# Patient Record
Sex: Male | Born: 1985 | Race: White | Hispanic: No
Health system: Midwestern US, Community
[De-identification: ages and names within clinical notes are randomized; demographics above are authoritative.]

## PROBLEM LIST (undated history)

## (undated) HISTORY — PX: HERNIA REPAIR: SHX51

---

## 2005-05-03 ENCOUNTER — Emergency Department: Payer: Self-pay | Admitting: Emergency Medicine

## 2011-09-20 ENCOUNTER — Ambulatory Visit: Payer: Self-pay

## 2012-03-10 ENCOUNTER — Emergency Department: Payer: Self-pay | Admitting: Emergency Medicine

## 2012-03-10 LAB — COMPREHENSIVE METABOLIC PANEL
Albumin: 4.5 g/dL (ref 3.4–5.0)
Anion Gap: 3 — ABNORMAL LOW (ref 7–16)
BUN: 14 mg/dL (ref 7–18)
Bilirubin,Total: 0.5 mg/dL (ref 0.2–1.0)
Co2: 29 mmol/L (ref 21–32)
Creatinine: 1.13 mg/dL (ref 0.60–1.30)
EGFR (African American): >60
Glucose: 98 mg/dL (ref 65–99)
SGOT(AST): 18 U/L (ref 15–37)
SGPT (ALT): 21 U/L (ref 12–78)
Sodium: 137 mmol/L (ref 136–145)
Total Protein: 7.9 g/dL (ref 6.4–8.2)

## 2012-03-10 LAB — URINALYSIS, COMPLETE
Blood: NEGATIVE
Glucose,UR: NEGATIVE mg/dL (ref 0–75)
Nitrite: NEGATIVE
Ph: 8 (ref 4.5–8.0)
Squamous Epithelial: NONE SEEN
WBC UR: 1 /HPF (ref 0–5)

## 2012-03-10 LAB — CBC
HCT: 48.2 % (ref 40.0–52.0)
HGB: 16.4 g/dL (ref 13.0–18.0)
Platelet: 258 10*3/uL (ref 150–440)
RBC: 5.84 10*6/uL (ref 4.40–5.90)
WBC: 9.4 10*3/uL (ref 3.8–10.6)

## 2016-09-23 ENCOUNTER — Inpatient Hospital Stay
Admit: 2016-09-23 | Discharge: 2016-09-23 | Disposition: A | Discharge status: Home / Self Care | Payer: BLUE CROSS/BLUE SHIELD | Attending: Emergency Medicine

## 2016-09-23 DIAGNOSIS — S161XXA Strain of muscle, fascia and tendon at neck level, initial encounter: Secondary | ICD-10-CM

## 2016-09-23 MED ORDER — TRAMADOL 50 MG TAB
50 mg | ORAL_TABLET | Freq: Four times a day (QID) | ORAL | 0 refills | Status: AC | PRN
Start: 2016-09-23 — End: ?

## 2016-09-23 MED ORDER — KETOROLAC TROMETHAMINE 30 MG/ML INJECTION
30 mg/mL (1 mL) | INTRAMUSCULAR | Status: AC
Start: 2016-09-23 — End: 2016-09-23
  Administered 2016-09-23: 10:00:00 via INTRAMUSCULAR

## 2016-09-23 MED ORDER — CYCLOBENZAPRINE 10 MG TAB
10 mg | ORAL | Status: AC
Start: 2016-09-23 — End: 2016-09-23
  Administered 2016-09-23: 10:00:00 via ORAL

## 2016-09-23 MED ORDER — CYCLOBENZAPRINE 10 MG TAB
10 mg | ORAL_TABLET | Freq: Three times a day (TID) | ORAL | 0 refills | Status: AC | PRN
Start: 2016-09-23 — End: ?

## 2016-09-23 MED FILL — CYCLOBENZAPRINE 10 MG TAB: 10 mg | ORAL | Qty: 1

## 2016-09-23 MED FILL — KETOROLAC TROMETHAMINE 30 MG/ML INJECTION: 30 mg/mL (1 mL) | INTRAMUSCULAR | Qty: 2

## 2016-09-23 NOTE — ED Notes (Signed)
Patient (s) was given copy of dc instructions and two paper script(s) and no electronic scripts.  Patient (s) has verbalized understanding of instructions and script (s).  Patient was given a current medication reconciliation form and verbalized understanding of their medications.   Patient (s) has verbalized understanding of the importance of discussing medications with  his or her physician or clinic they will be following up with.  Patient alert and oriented and in no acute distress.  Patient offered wheelchair from treatment area to hospital entrance, patient declined wheelchair. Patient left ED with family.

## 2016-09-23 NOTE — ED Provider Notes (Signed)
Patient is a 31 y.o. male presenting with neck pain. The history is provided by the patient.   Neck Pain    This is a new problem. The current episode started 12 to 24 hours ago. The problem occurs constantly. The problem has not changed since onset.The pain is associated with nothing. There has been no fever. The pain is present in the left side. The quality of the pain is described as stabbing and aching. The pain is moderate. The symptoms are aggravated by bending and twisting. Stiffness is present at night. Pertinent negatives include no photophobia, no visual change, no chest pain, no syncope, no numbness, no weight loss, no headaches, no bowel incontinence, no bladder incontinence, no leg pain, no paresis, no tingling and no weakness.        History reviewed. No pertinent past medical history.    Past Surgical History:   Procedure Laterality Date   ??? HX HERNIA REPAIR           History reviewed. No pertinent family history.    Social History     Social History   ??? Marital status: DIVORCED     Spouse name: N/A   ??? Number of children: N/A   ??? Years of education: N/A     Occupational History   ??? Not on file.     Social History Main Topics   ??? Smoking status: Never Smoker   ??? Smokeless tobacco: Never Used   ??? Alcohol use Yes      Comment: Ocassionally   ??? Drug use: No   ??? Sexual activity: Not on file     Other Topics Concern   ??? Not on file     Social History Narrative   ??? No narrative on file         ALLERGIES: Review of patient's allergies indicates no known allergies.    Review of Systems   Constitutional: Negative for chills, fever and weight loss.   HENT: Negative for congestion, rhinorrhea, sneezing and sore throat.    Eyes: Negative for photophobia, redness and visual disturbance.   Respiratory: Negative for shortness of breath.    Cardiovascular: Negative for chest pain, leg swelling and syncope.   Gastrointestinal: Negative for abdominal pain, bowel incontinence, nausea and vomiting.    Genitourinary: Negative for bladder incontinence, difficulty urinating and frequency.   Musculoskeletal: Positive for neck pain. Negative for back pain, myalgias and neck stiffness.   Skin: Negative for rash.   Neurological: Negative for dizziness, tingling, syncope, weakness, numbness and headaches.   Hematological: Negative for adenopathy.   All other systems reviewed and are negative.      Vitals:    09/23/16 0526   BP: 148/86   Pulse: 80   Resp: 20   Temp: 97.9 ??F (36.6 ??C)   SpO2: 99%   Weight: 68 kg (150 lb)   Height: 5\' 8"  (1.727 m)            Physical Exam   Constitutional: He is oriented to person, place, and time. He appears well-developed and well-nourished.   HENT:   Head: Normocephalic and atraumatic.   Mouth/Throat: Oropharynx is clear and moist and mucous membranes are normal.   Eyes: EOM are normal.   Neck: Normal range of motion and full passive range of motion without pain. Neck supple.   Cardiovascular: Normal rate, regular rhythm, normal heart sounds, intact distal pulses and normal pulses.    No murmur heard.  Pulmonary/Chest: Effort normal and  breath sounds normal. No respiratory distress. He exhibits no tenderness.   Abdominal: Soft. Normal appearance and bowel sounds are normal. There is no tenderness. There is no rebound and no guarding.   Musculoskeletal:        Arms:  Neurological: He is alert and oriented to person, place, and time. He has normal strength.   Skin: Skin is warm, dry and intact. No rash noted. No erythema.   Psychiatric: He has a normal mood and affect. His speech is normal and behavior is normal. Judgment and thought content normal.   Nursing note and vitals reviewed.       MDM  Number of Diagnoses or Management Options  Strain of neck muscle, initial encounter:   Diagnosis management comments: Strain vs spasm        ED Course       Procedures    LABORATORY TESTS:  No results found for this or any previous visit (from the past 12 hour(s)).    IMAGING RESULTS:   No results found.    MEDICATIONS GIVEN:  Medications   ketorolac (TORADOL) injection 60 mg (60 mg IntraMUSCular Given 09/23/16 0620)   cyclobenzaprine (FLEXERIL) tablet 10 mg (10 mg Oral Given 09/23/16 0620)     Patient informed of results of workup and is comfortable with discharge to home to follow up with PCP.  They are instructed to return as needed for worsening condition.  IMPRESSION:  1. Strain of neck muscle, initial encounter        PLAN:  1.   Discharge Medication List as of 09/23/2016  6:34 AM      START taking these medications    Details   cyclobenzaprine (FLEXERIL) 10 mg tablet Take 1 Tab by mouth three (3) times daily as needed for Muscle Spasm(s)., Print, Disp-15 Tab, R-0      traMADol (ULTRAM) 50 mg tablet Take 1 Tab by mouth every six (6) hours as needed for Pain. Max Daily Amount: 200 mg. Indications: Pain, Print, Disp-10 Tab, R-0           2.   Follow-up Information     Follow up With Details Comments Contact Info    None Call  None [phone] Patient stated that they have no PCP      [facility] EMERGENCY DEPT  As needed, If symptoms worsen [address] [place] [address]  [phone]    [facility] - [facility]   [address] [place] [address]  [phone]        Return to ED if worse

## 2017-05-12 ENCOUNTER — Emergency Department
Admission: EM | Admit: 2017-05-12 | Discharge: 2017-05-12 | Disposition: A | Discharge status: Home / Self Care | Payer: BLUE CROSS/BLUE SHIELD | Attending: Emergency Medicine | Admitting: Emergency Medicine

## 2017-05-12 DIAGNOSIS — F17290 Nicotine dependence, other tobacco product, uncomplicated: Secondary | ICD-10-CM | POA: Diagnosis not present

## 2017-05-12 DIAGNOSIS — R0989 Other specified symptoms and signs involving the circulatory and respiratory systems: Secondary | ICD-10-CM | POA: Insufficient documentation

## 2017-05-12 MED ORDER — FAMOTIDINE 40 MG PO TABS: 40.0000 mg | ORAL_TABLET | Freq: Every evening | ORAL | 0 refills | Status: DC

## 2017-05-12 MED ORDER — OXYMETAZOLINE HCL 0.05 % NA SOLN
NASAL | Status: AC
  Filled 2017-05-12: qty 15

## 2017-05-12 MED ORDER — FAMOTIDINE 20 MG PO TABS: 20.0000 mg | ORAL_TABLET | Freq: Once | ORAL | Status: DC

## 2017-05-12 MED ORDER — FAMOTIDINE 20 MG PO TABS
40.0000 mg | ORAL_TABLET | Freq: Every day | ORAL | Status: DC
  Administered 2017-05-12: 40 mg via ORAL

## 2017-05-12 MED ORDER — FAMOTIDINE 20 MG PO TABS
ORAL_TABLET | ORAL | Status: AC
  Administered 2017-05-12: 40 mg via ORAL
  Filled 2017-05-12: qty 2

## 2017-05-12 NOTE — ED Notes (Signed)
Pt given warm coke.

## 2017-05-12 NOTE — ED Notes (Signed)
No orders at this time per MD Sharma CovertNorman

## 2017-05-12 NOTE — ED Triage Notes (Signed)
Patient c/o food stuck in throat. Patient reports he was eating a ham sandwich at approx 2000 and feels like the sandwich is stuck in upper left throat. Patient is talking in complete sentences, no distress noted, maintaining control of saliva.

## 2017-05-12 NOTE — ED Provider Notes (Signed)
[facility] Emergency Department Provider Note  ____________________________________________   First MD Initiated Contact with Patient 05/12/17 2129     (approximate)  I have reviewed the triage vital signs and the nursing notes.   HISTORY  Chief Complaint Swallowed Foreign Body   HPI Todd Fleming is a 32 y.o. male without any chronic medical conditions was presented to the emergency department with a foreign body sensation in his throat.  He says that he was eating ham sandwich around 8 PM tonight when he says that he swallowed a bite and then sniffed at the same time and says that he has had an irritation on the left side of his throat ever since.  He says that when he feels like he moves his head to the right that the foreign body moves position over the right as well.  He says that he is been without any respiratory distress and feels that he is able to breathe normally.  Says that he is also been drinking water without any issue.  History reviewed. No pertinent past medical history.  There are no active problems to display for this patient.   Past Surgical History:  Procedure Laterality Date  . HERNIA REPAIR      Prior to Admission medications   Not on File    Allergies Patient has no known allergies.  No family history on file.  Social History Social History   Tobacco Use  . Smoking status: Current Every Day Smoker    Types: E-cigarettes  . Smokeless tobacco: Current User  Substance Use Topics  . Alcohol use: Yes  . Drug use: Not on file    Review of Systems  Constitutional: No fever/chills Eyes: No visual changes. ENT: As above Cardiovascular: Denies chest pain. Respiratory: Denies shortness of breath. Gastrointestinal: No abdominal pain.  No nausea, no vomiting.  No diarrhea.  No constipation. Genitourinary: Negative for dysuria. Musculoskeletal: Negative for back pain. Skin: Negative for rash. Neurological: Negative  for headaches, focal weakness or numbness.   ____________________________________________   PHYSICAL EXAM:  VITAL SIGNS: ED Triage Vitals  Enc Vitals Group     BP 05/12/17 2038 134/68     Pulse Rate 05/12/17 2038 60     Resp 05/12/17 2038 17     Temp 05/12/17 2038 98.4 F (36.9 C)     Temp Source 05/12/17 2038 Oral     SpO2 05/12/17 2038 99 %     Weight 05/12/17 2039 155 lb (70.3 kg)     Height 05/12/17 2039 5\' 8"  (1.727 m)     Head Circumference --      Peak Flow --      Pain Score 05/12/17 2038 0     Pain Loc --      Pain Edu? --      Excl. in GC? --     Constitutional: Alert and oriented. Well appearing and in no acute distress. Eyes: Conjunctivae are normal.  Head: Atraumatic. Nose: No congestion/rhinnorhea. Mouth/Throat: Mucous membranes are moist.  On oropharyngeal exam I do not see any foreign bodies.  No swelling or erythema to the posterior pharynx. Neck: No stridor.   Cardiovascular: Normal rate, regular rhythm. Grossly normal heart sounds.   Respiratory: Normal respiratory effort.  No retractions. Lungs CTAB. Gastrointestinal: Soft and nontender. No distention.  Musculoskeletal: No lower extremity tenderness nor edema.  No joint effusions. Neurologic:  Normal speech and language. No gross focal neurologic deficits are appreciated. Skin:  Skin is  warm, dry and intact. No rash noted. Psychiatric: Mood and affect are normal. Speech and behavior are normal.  ____________________________________________   LABS (all labs ordered are listed, but only abnormal results are displayed)  Labs Reviewed - No data to display ____________________________________________  EKG   ____________________________________________  RADIOLOGY   ____________________________________________   PROCEDURES  Procedure(s) performed:    Fiberoptic laryngoscopy Date/Time: 05/12/2017 10:57 PM Performed by: Myrna BlazerSchaevitz, Rubena Roseman Matthew, MD Authorized by: Myrna BlazerSchaevitz, Makail Watling Matthew,  MD  Consent: Verbal consent obtained. Consent given by: patient Comments: Patient gave 3 sprays of aspirin to each nostril.  The patient was able to tolerate the laryngoscope to the bilateral nostrils.  The tip of the epiglottis was able to be visualized but I was unable to visualize the cords.  Patient was not tolerating the procedure well secondary to the foreign body sensation of the fiberoptic scope.  However, I was not able to visualize any foreign bodies.     Critical Care performed:   ____________________________________________   INITIAL IMPRESSION / ASSESSMENT AND PLAN / ED COURSE  Pertinent labs & imaging results that were available during my care of the patient were reviewed by me and considered in my medical decision making (see chart for details).  DDX: Esophageal foreign body, pharyngeal foreign body, laryngeal foreign body, irritation of the esophagus, irritation of the larynx As part of my medical decision making, I reviewed the following data within the electronic MEDICAL RECORD NUMBER Notes from prior ED visits  ----------------------------------------- 10:59 PM on 05/12/2017 -----------------------------------------  Discussed case with Dr. Andee PolesVaught of ear nose and throat.  He says that it is unlikely the patient has a foreign body sitting on his larynx unless into the bone.  The patient was not eating any meat containing bones.  He said that he was eating a slice of ham like a Bologna with bread.  Dr. Andee PolesVaught recommends that the patient is still having a foreign body sensation in the morning that he report to the office.  Also recommends an antacid for home use.  Patient understand the plan willing to comply.  Says that he still feels an irritation that is slight into the center of his throat at this time but does not feel a shifting sensation that he was feeling before.  Remains without any respiratory distress and able to swallow his own secretions.  Also tolerated warm cocaine  in the emergency department. ____________________________________________   FINAL CLINICAL IMPRESSION(S) / ED DIAGNOSES  Foreign body sensation.    NEW MEDICATIONS STARTED DURING THIS VISIT:  New Prescriptions   No medications on file     Note:  This document was prepared using Dragon voice recognition software and may include unintentional dictation errors.     Myrna BlazerSchaevitz, Adalynne Steffensmeier Matthew, MD 05/12/17 2300

## 2017-05-12 NOTE — ED Notes (Signed)
Pt ambulatory to toilet by self.  

## 2017-07-09 ENCOUNTER — Emergency Department: Payer: BLUE CROSS/BLUE SHIELD

## 2017-07-09 ENCOUNTER — Emergency Department
Admission: EM | Admit: 2017-07-09 | Discharge: 2017-07-09 | Disposition: A | Discharge status: Home / Self Care | Payer: BLUE CROSS/BLUE SHIELD | Attending: Emergency Medicine | Admitting: Emergency Medicine

## 2017-07-09 ENCOUNTER — Other Ambulatory Visit: Payer: Self-pay

## 2017-07-09 ENCOUNTER — Encounter: Payer: Self-pay | Admitting: Emergency Medicine

## 2017-07-09 DIAGNOSIS — F17210 Nicotine dependence, cigarettes, uncomplicated: Secondary | ICD-10-CM | POA: Diagnosis not present

## 2017-07-09 DIAGNOSIS — R0789 Other chest pain: Secondary | ICD-10-CM

## 2017-07-09 DIAGNOSIS — Z79899 Other long term (current) drug therapy: Secondary | ICD-10-CM | POA: Insufficient documentation

## 2017-07-09 DIAGNOSIS — R079 Chest pain, unspecified: Secondary | ICD-10-CM | POA: Diagnosis present

## 2017-07-09 LAB — CBC
HEMATOCRIT: 45.5 % (ref 40.0–52.0)
Hemoglobin: 15.8 g/dL (ref 13.0–18.0)
MCH: 28.3 pg (ref 26.0–34.0)
MCHC: 34.7 g/dL (ref 32.0–36.0)
MCV: 81.5 fL (ref 80.0–100.0)
Platelets: 295 10*3/uL (ref 150–440)
RBC: 5.59 MIL/uL (ref 4.40–5.90)
RDW: 13 % (ref 11.5–14.5)
WBC: 10.4 10*3/uL (ref 3.8–10.6)

## 2017-07-09 LAB — BASIC METABOLIC PANEL
Anion gap: 8 (ref 5–15)
BUN: 19 mg/dL (ref 6–20)
CO2: 28 mmol/L (ref 22–32)
Calcium: 9.3 mg/dL (ref 8.9–10.3)
Chloride: 102 mmol/L (ref 101–111)
Creatinine, Ser: 0.98 mg/dL (ref 0.61–1.24)
GFR calc Af Amer: >60 mL/min (ref >60)
GFR calc non Af Amer: >60 mL/min (ref >60)
GLUCOSE: 95 mg/dL (ref 65–99)
POTASSIUM: 3.8 mmol/L (ref 3.5–5.1)
Sodium: 138 mmol/L (ref 135–145)

## 2017-07-09 LAB — TROPONIN I
Troponin I: <0.03 ng/mL (ref <0.03)
Troponin I: <0.03 ng/mL (ref <0.03)

## 2017-07-09 MED ORDER — IOPAMIDOL (ISOVUE-370) INJECTION 76%
75.0000 mL | Freq: Once | INTRAVENOUS | Status: AC | PRN
  Administered 2017-07-09: 75 mL via INTRAVENOUS

## 2017-07-09 MED ORDER — LORAZEPAM 1 MG PO TABS: 1.0000 mg | ORAL_TABLET | Freq: Three times a day (TID) | ORAL | 0 refills | Status: AC | PRN

## 2017-07-09 NOTE — ED Notes (Signed)
Reviewed discharge instructions, follow-up care, and prescriptions with patient. Patient verbalized understanding of all information reviewed. Patient stable, with no distress noted at this time.    

## 2017-07-09 NOTE — ED Provider Notes (Signed)
[facility] Emergency Department Provider Note   ____________________________________________    I have reviewed the triage vital signs and the nursing notes.   HISTORY  Chief Complaint Chest Pain     HPI Todd Fleming is a 32 y.o. male who presents with complaints of chest pain.  Patient reports he has had discomfort behind his left nipple for approximately 1 month.  He reports while watching a movie today he developed severe pain in the left chest, he then developed shortness of breath as well and came straight to the emergency department.  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  Denies history of blood clots.  No fevers or chills.  Continues to have pain behind his left nipple currently but it has improved.  No diaphoresis or history of heart disease.  Does not smoke   History reviewed. No pertinent past medical history.  There are no active problems to display for this patient.   Past Surgical History:  Procedure Laterality Date  . HERNIA REPAIR      Prior to Admission medications   Medication Sig Start Date End Date Taking? Authorizing Provider  famotidine (PEPCID) 40 MG tablet Take 1 tablet (40 mg total) by mouth every evening. 05/12/17 05/12/18  Schaevitz, Myra Rude, MD  LORazepam (ATIVAN) 1 MG tablet Take 1 tablet (1 mg total) by mouth every 8 (eight) hours as needed for up to 10 doses for anxiety. 07/09/17 07/15/17  Jene Every, MD     Allergies Patient has no known allergies.  History reviewed. No pertinent family history.  Social History Social History   Tobacco Use  . Smoking status: Current Every Day Smoker    Types: E-cigarettes  . Smokeless tobacco: Current User  Substance Use Topics  . Alcohol use: Yes  . Drug use: Not on file    Review of Systems  Constitutional: No fever/chills Eyes: No visual changes.  ENT: No sore throat. Cardiovascular: As above Respiratory: As above Gastrointestinal: No  abdominal pain.  No nausea, no vomiting.   Genitourinary: Negative for dysuria. Musculoskeletal: Negative for back pain. Skin: Negative for rash. Neurological: Negative for headaches   ____________________________________________   PHYSICAL EXAM:  VITAL SIGNS: ED Triage Vitals [07/09/17 1817]  Enc Vitals Group     BP (!) 143/80     Pulse Rate 74     Resp 18     Temp 98.4 F (36.9 C)     Temp Source Oral     SpO2 100 %     Weight 70.3 kg (155 lb)     Height 1.753 m ( )     Head Circumference      Peak Flow      Pain Score 4     Pain Loc      Pain Edu?      Excl. in GC?     Constitutional: Alert and oriented. No acute distress. Pleasant and interactive Eyes: Conjunctivae are normal.   Nose: No congestion/rhinnorhea. Mouth/Throat: Mucous membranes are moist.    Cardiovascular: Normal rate, regular rhythm. Grossly normal heart sounds.  Good peripheral circulation. Respiratory: Normal respiratory effort.  No retractions. Lungs CTAB. Gastrointestinal: Soft and nontender. No distention.  No CVA tenderness. Genitourinary: deferred Musculoskeletal: No lower extremity tenderness nor edema.  Warm and well perfused Neurologic:  Normal speech and language. No gross focal neurologic deficits are appreciated.  Skin:  Skin is warm, dry and intact. No rash noted. Psychiatric: Mood and  affect are normal. Speech and behavior are normal.  ____________________________________________   LABS (all labs ordered are listed, but only abnormal results are displayed)  Labs Reviewed  BASIC METABOLIC PANEL  CBC  TROPONIN I  TROPONIN I   ____________________________________________  EKG  ED ECG REPORT I, Jene Every, the attending physician, personally viewed and interpreted this ECG.  Date: 07/09/2017  Rhythm: normal sinus rhythm QRS Axis: normal Intervals: Right bundle branch block ST/T Wave abnormalities: normal Narrative Interpretation: no evidence of acute  ischemia  ____________________________________________  RADIOLOGY  X-ray unremarkable CT angiography ____________________________________________   PROCEDURES  Procedure(s) performed: No  Procedures   Critical Care performed: No ____________________________________________   INITIAL IMPRESSION / ASSESSMENT AND PLAN / ED COURSE  Pertinent labs & imaging results that were available during my care of the patient were reviewed by me and considered in my medical decision making (see chart for details).  Patient presents with chest pain as described above.  No old EKG to compare to.  Given chronic pain in the left chest and history of significant amounts of travel will obtain CT angiography to rule out DVT.  Will repeat troponin as well.  ----------------------------------------- 10:10 PM on 07/09/2017 -----------------------------------------  CT scan negative for pulmonary embolism, troponin negative x2.  Patient feels well.  Will refer to cardiology for further work-up.  He is strongly suspicious that anxiety is related for his chest pain, will trial Ativan 1 mg p.o. as needed for anxiety    ____________________________________________   FINAL CLINICAL IMPRESSION(S) / ED DIAGNOSES  Final diagnoses:  Atypical chest pain        Note:  This document was prepared using Dragon voice recognition software and may include unintentional dictation errors.    Jene Every, MD 07/09/17 2211

## 2017-07-09 NOTE — ED Notes (Signed)
Patient transported to CT 

## 2017-07-09 NOTE — ED Triage Notes (Signed)
Pt arrived via POV with reports of sudden onset of chest pain while watching a movie. Pt states he has hx of anxiety and panic attacks, but states he started having some shortness of breath and chest pain on the left behind the nipple.  No prior hx of chest pain with panic attacks.

## 2017-07-09 NOTE — ED Notes (Signed)
Patient c/o intermittent left chest pain and SOB for 1 month with increasing severity today.

## 2017-07-13 ENCOUNTER — Telehealth: Payer: Self-pay

## 2017-07-13 NOTE — Telephone Encounter (Signed)
Attempted to call patient to scheduled ED fu  They were seen on 07/09/17 for CP  Will try again at a later time

## 2017-07-15 NOTE — Telephone Encounter (Signed)
Scheduled

## 2017-09-02 ENCOUNTER — Ambulatory Visit: Payer: BLUE CROSS/BLUE SHIELD | Admitting: Cardiovascular Disease

## 2017-09-02 ENCOUNTER — Encounter

## 2017-09-04 ENCOUNTER — Emergency Department
Admission: EM | Admit: 2017-09-04 | Discharge: 2017-09-04 | Disposition: A | Discharge status: Home / Self Care | Payer: BLUE CROSS/BLUE SHIELD | Attending: Emergency Medicine | Admitting: Emergency Medicine

## 2017-09-04 ENCOUNTER — Other Ambulatory Visit: Payer: Self-pay

## 2017-09-04 ENCOUNTER — Emergency Department: Payer: BLUE CROSS/BLUE SHIELD

## 2017-09-04 DIAGNOSIS — R109 Unspecified abdominal pain: Secondary | ICD-10-CM | POA: Diagnosis present

## 2017-09-04 DIAGNOSIS — K2970 Gastritis, unspecified, without bleeding: Secondary | ICD-10-CM

## 2017-09-04 DIAGNOSIS — R110 Nausea: Secondary | ICD-10-CM | POA: Insufficient documentation

## 2017-09-04 DIAGNOSIS — F17228 Nicotine dependence, chewing tobacco, with other nicotine-induced disorders: Secondary | ICD-10-CM | POA: Diagnosis not present

## 2017-09-04 DIAGNOSIS — F17290 Nicotine dependence, other tobacco product, uncomplicated: Secondary | ICD-10-CM | POA: Diagnosis not present

## 2017-09-04 LAB — COMPREHENSIVE METABOLIC PANEL
ALT: 21 U/L (ref 0–44)
ANION GAP: 10 (ref 5–15)
AST: 18 U/L (ref 15–41)
Albumin: 4.5 g/dL (ref 3.5–5.0)
Alkaline Phosphatase: 88 U/L (ref 38–126)
BUN: 14 mg/dL (ref 6–20)
CHLORIDE: 103 mmol/L (ref 98–111)
CO2: 26 mmol/L (ref 22–32)
Calcium: 9.5 mg/dL (ref 8.9–10.3)
Creatinine, Ser: 1.04 mg/dL (ref 0.61–1.24)
GFR calc non Af Amer: >60 mL/min (ref >60)
Glucose, Bld: 105 mg/dL — ABNORMAL HIGH (ref 70–99)
POTASSIUM: 4.4 mmol/L (ref 3.5–5.1)
SODIUM: 139 mmol/L (ref 135–145)
Total Bilirubin: 0.8 mg/dL (ref 0.3–1.2)
Total Protein: 7.5 g/dL (ref 6.5–8.1)

## 2017-09-04 LAB — MONONUCLEOSIS SCREEN: Mono Screen: NEGATIVE

## 2017-09-04 LAB — CBC
HEMATOCRIT: 47.6 % (ref 40.0–52.0)
Hemoglobin: 16.4 g/dL (ref 13.0–18.0)
MCH: 27.9 pg (ref 26.0–34.0)
MCHC: 34.5 g/dL (ref 32.0–36.0)
MCV: 81.1 fL (ref 80.0–100.0)
Platelets: 276 10*3/uL (ref 150–440)
RBC: 5.87 MIL/uL (ref 4.40–5.90)
RDW: 12.9 % (ref 11.5–14.5)
WBC: 14.7 10*3/uL — ABNORMAL HIGH (ref 3.8–10.6)

## 2017-09-04 LAB — URINALYSIS, COMPLETE (UACMP) WITH MICROSCOPIC
Bacteria, UA: NONE SEEN
Bilirubin Urine: NEGATIVE
GLUCOSE, UA: NEGATIVE mg/dL
Hgb urine dipstick: NEGATIVE
KETONES UR: 5 mg/dL — AB
Leukocytes, UA: NEGATIVE
Nitrite: NEGATIVE
PH: 5 (ref 5.0–8.0)
Protein, ur: NEGATIVE mg/dL
Specific Gravity, Urine: 1.02 (ref 1.005–1.030)
Squamous Epithelial / LPF: NONE SEEN (ref 0–5)

## 2017-09-04 LAB — LIPASE, BLOOD: LIPASE: 25 U/L (ref 11–51)

## 2017-09-04 MED ORDER — ONDANSETRON HCL 4 MG/2ML IJ SOLN
4.0000 mg | Freq: Once | INTRAMUSCULAR | Status: AC
  Administered 2017-09-04: 4 mg via INTRAVENOUS
  Filled 2017-09-04: qty 2

## 2017-09-04 MED ORDER — GI COCKTAIL ~~LOC~~
30.0000 mL | Freq: Once | ORAL | Status: AC
  Administered 2017-09-04: 30 mL via ORAL
  Filled 2017-09-04: qty 30

## 2017-09-04 MED ORDER — DIPHENHYDRAMINE HCL 50 MG/ML IJ SOLN
25.0000 mg | Freq: Once | INTRAMUSCULAR | Status: AC
  Administered 2017-09-04: 25 mg via INTRAVENOUS
  Filled 2017-09-04: qty 1

## 2017-09-04 MED ORDER — PANTOPRAZOLE SODIUM 40 MG PO TBEC: 40.0000 mg | DELAYED_RELEASE_TABLET | Freq: Every day | ORAL | 1 refills | Status: DC

## 2017-09-04 MED ORDER — SODIUM CHLORIDE 0.9 % IV BOLUS
1000.0000 mL | Freq: Once | INTRAVENOUS | Status: AC
  Administered 2017-09-04: 1000 mL via INTRAVENOUS

## 2017-09-04 MED ORDER — MORPHINE SULFATE (PF) 4 MG/ML IV SOLN
4.0000 mg | Freq: Once | INTRAVENOUS | Status: AC
Start: 2017-09-04 — End: 2017-09-04
  Administered 2017-09-04: 4 mg via INTRAVENOUS
  Filled 2017-09-04: qty 1

## 2017-09-04 MED ORDER — ONDANSETRON 4 MG PO TBDP: 4.0000 mg | ORAL_TABLET | Freq: Three times a day (TID) | ORAL | 0 refills | Status: DC | PRN

## 2017-09-04 MED ORDER — HYDROCODONE-ACETAMINOPHEN 5-325 MG PO TABS: 1.0000 | ORAL_TABLET | ORAL | 0 refills | Status: DC | PRN

## 2017-09-04 MED ORDER — IOPAMIDOL (ISOVUE-300) INJECTION 61%
100.0000 mL | Freq: Once | INTRAVENOUS | Status: AC | PRN
  Administered 2017-09-04: 100 mL via INTRAVENOUS

## 2017-09-04 MED ORDER — IOPAMIDOL (ISOVUE-300) INJECTION 61%
30.0000 mL | Freq: Once | INTRAVENOUS | Status: AC | PRN
  Administered 2017-09-04: 30 mL via ORAL

## 2017-09-04 NOTE — ED Notes (Signed)
Pt returned to room at this time

## 2017-09-04 NOTE — ED Notes (Signed)
Patient c/o itching and had two welts on his forearm after zofran given IV. Dr. Lenard LancePaduchowski aware.

## 2017-09-04 NOTE — ED Triage Notes (Signed)
Mid to lower sharp and stabbing pain X 2 days. Nausea. No emesis or diarrhea. Pt alert and oriented X4, active, cooperative, pt in NAD. RR even and unlabored, color WNL.  Normal BM.

## 2017-09-04 NOTE — ED Notes (Signed)
Patient transported to CT 

## 2017-09-04 NOTE — ED Notes (Signed)
Pt l  Arm redness  Subsided

## 2017-09-04 NOTE — ED Provider Notes (Addendum)
[facility] Emergency Department Provider Note  Time seen: 3:46 PM  I have reviewed the triage vital signs and the nursing notes.   HISTORY  Chief Complaint Abdominal Pain    HPI Todd Fleming is a 32 y.o. male with no past medical history who presents to the emergency department for left sided abdominal pain.  According to the patient for the past 3 days he has been experiencing intermittent significant left-sided abdominal pain along with nausea, denies any vomiting or diarrhea, fever dysuria or hematuria.  States he has had a sore throat for the past few days as well.  Went to urgent care today they tested him for mono which was negative and sent him to the emergency department for further evaluation.  Patient describes his pain is moderate and intermittent sharp pains mostly in the left upper and left mid abdomen.   History reviewed. No pertinent past medical history.  There are no active problems to display for this patient.   Past Surgical History:  Procedure Laterality Date  . HERNIA REPAIR      Prior to Admission medications   Medication Sig Start Date End Date Taking? Authorizing Provider  famotidine (PEPCID) 40 MG tablet Take 1 tablet (40 mg total) by mouth every evening. 05/12/17 05/12/18  Schaevitz, Myra Rudeavid Matthew, MD    No Known Allergies  No family history on file.  Social History Social History   Tobacco Use  . Smoking status: Current Every Day Smoker    Types: E-cigarettes  . Smokeless tobacco: Current User  Substance Use Topics  . Alcohol use: Yes  . Drug use: Not on file    Review of Systems Constitutional: Negative for fever. Eyes: Negative for visual complaints ENT: Negative for recent illness/congestion Cardiovascular: Negative for chest pain. Respiratory: Negative for shortness of breath. Gastrointestinal: Intermittent sharp left-sided abdominal pains, moderate severity.  Positive for nausea but negative for vomiting  or diarrhea Genitourinary: Negative for urinary compaints Musculoskeletal: Negative for musculoskeletal complaints Skin: Negative for skin complaints  Neurological: Negative for headache All other ROS negative  ____________________________________________   PHYSICAL EXAM:  VITAL SIGNS: ED Triage Vitals [09/04/17 1455]  Enc Vitals Group     BP 130/74     Pulse Rate 63     Resp 16     Temp 98.5 F (36.9 C)     Temp Source Oral     SpO2 100 %     Weight 155 lb (70.3 kg)     Height 5\' 9"  (1.753 m)     Head Circumference      Peak Flow      Pain Score 8     Pain Loc      Pain Edu?      Excl. in GC?    Constitutional: Alert and oriented. Well appearing and in no distress. Eyes: Normal exam ENT   Head: Normocephalic and atraumatic.   Mouth/Throat: Mucous membranes are moist. Cardiovascular: Normal rate, regular rhythm. No murmur Respiratory: Normal respiratory effort without tachypnea nor retractions. Breath sounds are clear  Gastrointestinal: Soft, moderate left upper quadrant and left mid abdominal tenderness to palpation, mild left lower quadrant tenderness, abdomen otherwise benign without rebound guarding or distention. Musculoskeletal: Nontender with normal range of motion in all extremities.  Neurologic:  Normal speech and language. No gross focal neurologic deficits  Skin:  Skin is warm, dry and intact.  Psychiatric: Mood and affect are normal.  ____________________________________________   RADIOLOGY  CT negative for  acute abnormality.  Possible gastritis.  ____________________________________________   INITIAL IMPRESSION / ASSESSMENT AND PLAN / ED COURSE  Pertinent labs & imaging results that were available during my care of the patient were reviewed by me and considered in my medical decision making (see chart for details).  Patient presents to the emergency department for left-sided abdominal pain intermittent over the past 3 days.  Differential  would include colitis, diverticulitis, urinary tract infection, pyelonephritis, mononucleosis with splenomegaly.  Patient's throat examination appears well, no obvious erythema, no exudate or tonsillar hypertrophy.  Patient does have a moderate leukocytosis of 14,000 currently, otherwise normal labs.  Afebrile.  However given the moderate tenderness to palpation will obtain a CT scan of the abdomen for further evaluation.  Patient agreeable plan of care.  CT negative for acute abnormality besides possible gastritis which would fit the clinical picture.  We will dose a GI cocktail.  Patient's mononucleosis negative labs otherwise normal, white blood cell count is slightly elevated.  We will discharge with short course of pain and nausea medication.  I discussed return precautions for any worsening pain or development of fever.  We will also discharge with Protonix.  Patient agreeable to plan of care.  ____________________________________________   FINAL CLINICAL IMPRESSION(S) / ED DIAGNOSES  Left-sided abdominal pain    Minna Antis, MD 09/04/17 [name]    Minna Antis, MD 09/04/17 [phone]

## 2017-09-09 ENCOUNTER — Other Ambulatory Visit
Admission: RE | Admit: 2017-09-09 | Discharge: 2017-09-09 | Disposition: A | Discharge status: Home / Self Care | Payer: BLUE CROSS/BLUE SHIELD | Source: Ambulatory Visit | Attending: Gastroenterology | Admitting: Gastroenterology

## 2017-09-09 ENCOUNTER — Encounter: Payer: Self-pay | Admitting: Gastroenterology

## 2017-09-09 ENCOUNTER — Ambulatory Visit: Payer: BLUE CROSS/BLUE SHIELD | Admitting: Gastroenterology

## 2017-09-09 VITALS — BP 132/82 | HR 73 | Resp 17 | Wt 157.8 lb

## 2017-09-09 DIAGNOSIS — R1013 Epigastric pain: Secondary | ICD-10-CM | POA: Diagnosis not present

## 2017-09-09 MED ORDER — OMEPRAZOLE 40 MG PO CPDR: 40.0000 mg | DELAYED_RELEASE_CAPSULE | Freq: Two times a day (BID) | ORAL | 0 refills | Status: DC

## 2017-09-09 NOTE — Progress Notes (Signed)
so  Arlyss Repress, MD [address]  [address]  [place], [place] [address]  Main: [phone]  Fax: [phone]    Gastroenterology Consultation  Referring Provider:     No ref. provider found Primary Care Physician:  Patient, No Pcp Per Primary Gastroenterologist:  Dr. Arlyss Repress Reason for Consultation:  Acute dyspepsia        HPI:   Todd Fleming is a 32 y.o. male referred from the ER  for consultation & management of 1 week history of epigastric pain, sharp in nature, worse on empty stomach associated with nausea and bloating. He went to emergency room at [facility] on 09/04/2017 with these symptoms, CBC revealed mildly elevated leukocytosis, CMP and lipase were normal, CT A/P revealed mild thickening of the gastric antrum and pylorus. He was discharged home on Zofran, Protonix 40 mg daily. Patient woke up around 4 AM today with recurrence of severe epigastric pain. He called my office to make an urgent visit. He works as a Copywriter, advertising and travels frequently, eats outside on a regular basis. On several occasions, he had raw red meat He denies weight loss. He took Pepto-Bismol about 2 days ago and noticed that his stools were black He denies diarrhea, constipation, fever, chills Unknown H. Pylori status He smokes vape cigarettes Denies taking alcohol, drug abuse  NSAIDs: none  Antiplts/Anticoagulants/Anti thrombotics: none  GI Procedures: none He had inguinal hernia repair He denies family history of GI malignancy  No past medical history on file.  Past Surgical History:  Procedure Laterality Date  . HERNIA REPAIR      Current Outpatient Medications:  .  HYDROcodone-acetaminophen (NORCO/VICODIN) 5-325 MG tablet, Take 1 tablet by mouth every 4 (four) hours as needed., Disp: 8 tablet, Rfl: 0 .  ondansetron (ZOFRAN ODT) 4 MG disintegrating tablet, Take 1 tablet (4 mg total) by mouth every 8 (eight) hours as needed for nausea or vomiting.,  Disp: 20 tablet, Rfl: 0 .  pantoprazole (PROTONIX) 40 MG tablet, Take 1 tablet (40 mg total) by mouth daily., Disp: 30 tablet, Rfl: 1 .  famotidine (PEPCID) 40 MG tablet, Take 1 tablet (40 mg total) by mouth every evening. (Patient not taking: Reported on 09/09/2017), Disp: 15 tablet, Rfl: 0 .  omeprazole (PRILOSEC) 40 MG capsule, Take 1 capsule (40 mg total) by mouth 2 (two) times daily before a meal., Disp: 60 capsule, Rfl: 0    No family history on file.   Social History   Tobacco Use  . Smoking status: Current Every Day Smoker    Types: E-cigarettes  . Smokeless tobacco: Current User  Substance Use Topics  . Alcohol use: Yes  . Drug use: Not on file    Allergies as of 09/09/2017  . (No Known Allergies)    Review of Systems:    All systems reviewed and negative except where noted in HPI.   Physical Exam:  BP 132/82 (BP Location: Left Arm, Patient Position: Sitting, Cuff Size: Normal)   Pulse 73   Resp 17   Wt 157 lb 12.8 oz (71.6 kg)   BMI 23.30 kg/m  No LMP for male patient.  General:   Alert,  Well-developed, well-nourished, pleasant and cooperative in NAD Head:  Normocephalic and atraumatic. Eyes:  Sclera clear, no icterus.   Conjunctiva pink. Ears:  Normal auditory acuity. Nose:  No deformity, discharge, or lesions. Mouth:  No deformity or lesions,oropharynx pink & moist. Neck:  Supple; no masses or thyromegaly. Lungs:  Respirations even and unlabored.  Clear throughout to auscultation.   No wheezes, crackles, or rhonchi. No acute distress. Heart:  Regular rate and rhythm; no murmurs, clicks, rubs, or gallops. Abdomen:  Normal bowel sounds. Soft, mild epigastric tenderness and non-distended without masses, hepatosplenomegaly or hernias noted.  No guarding or rebound tenderness.   Rectal: Not performed Msk:  Symmetrical without gross deformities. Good, equal movement & strength bilaterally. Pulses:  Normal pulses noted. Extremities:  No clubbing or edema.  No  cyanosis. Neurologic:  Alert and oriented x3;  grossly normal neurologically. Skin:  Intact without significant lesions or rashes. No jaundice. Lymph Nodes:  No significant cervical adenopathy. Psych:  Alert and cooperative. Normal mood and affect.  Imaging Studies: reviewed  Assessment and Plan:   Todd Fleming is a 32 y.o. Caucasian male with no sigmoid can past medical history, presents with one-week history of symptoms of dyspepsia. CT revealed mild thickening of the gastric antrum and pylorus Differentials include peptic ulcer disease or H. Pylori gastritis or less likely malignancy  - Recommend upper endoscopy for further evaluation - Patient reports that he is going out of town Sunday and returning on Thursday next week - In the interim, I'll check H. Pylori breath test and H. Pylori IgG, if positive will start treatment for H. Pylori infection - Switch from Protonix to omeprazole 40 mg twice daily - Zofran as needed - I advised him to avoid red meat, raw meat   Follow up in 4 weeks   Arlyss Repressohini R Khadim Lundberg, MD

## 2017-09-13 ENCOUNTER — Telehealth: Payer: Self-pay | Admitting: Gastroenterology

## 2017-09-13 NOTE — Telephone Encounter (Signed)
Pt mother called again for results

## 2017-09-13 NOTE — Telephone Encounter (Signed)
Pt Mother left vm to see if pt H Pal[place] test came back

## 2017-09-15 NOTE — Telephone Encounter (Signed)
Spoke with pt mom and results have been provided

## 2017-10-14 ENCOUNTER — Encounter: Payer: Self-pay | Admitting: Gastroenterology

## 2017-10-14 ENCOUNTER — Ambulatory Visit: Payer: BLUE CROSS/BLUE SHIELD | Admitting: Gastroenterology

## 2017-10-14 DIAGNOSIS — R1013 Epigastric pain: Secondary | ICD-10-CM

## 2017-10-29 ENCOUNTER — Other Ambulatory Visit: Payer: Self-pay | Admitting: Gastroenterology

## 2017-10-29 DIAGNOSIS — R1013 Epigastric pain: Secondary | ICD-10-CM

## 2017-11-13 ENCOUNTER — Other Ambulatory Visit: Payer: Self-pay | Admitting: Gastroenterology

## 2017-11-13 DIAGNOSIS — R1013 Epigastric pain: Secondary | ICD-10-CM

## 2019-12-14 IMAGING — CT CT ABD-PELV W/ CM
2 of 4 series · 16 of 46 positions shown, 18 images · IV contrast (APPLIED)
Comparison: CT 03/10/2012

CLINICAL DATA: Mid to lower sharp abdominal pain with nausea

EXAM:
CT ABDOMEN AND PELVIS WITH CONTRAST
TECHNIQUE: Multidetector CT imaging of the abdomen and pelvis was performed
using the standard protocol following bolus administration of
intravenous contrast.
CONTRAST:  100mL [id number]-ASS IOPAMIDOL (I8U9UX-ASS) INJECTION 61%

[Series 2: routine abd/pel with · axial · 0.72mm/px · z∈[-295,+105]mm · 13 of 88 slices shown, 15 images]
[im 4/88  soft-tissue]
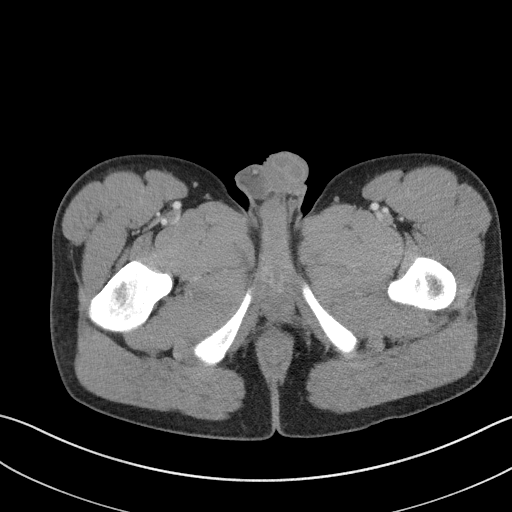
[im 4/88  bone]
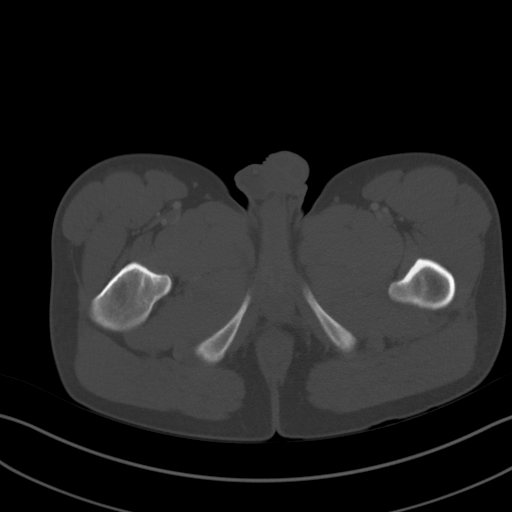
[im 11/88  soft-tissue]
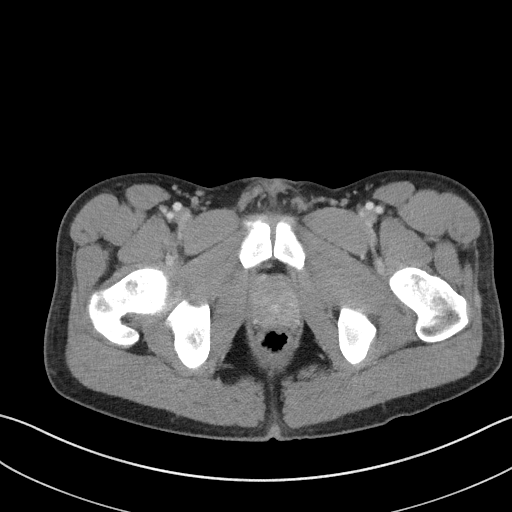
[im 18/88  soft-tissue]
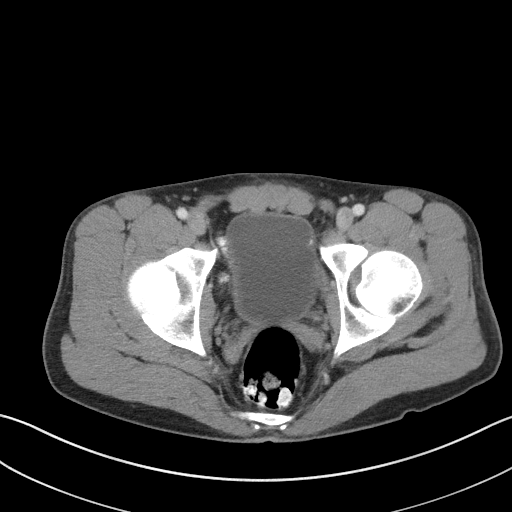
[im 25/88  soft-tissue]
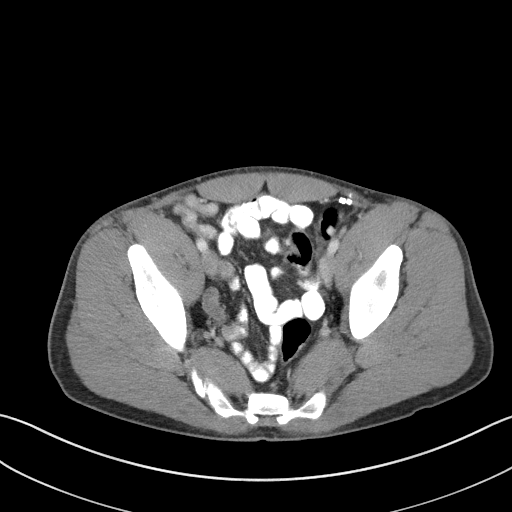
[im 32/88  soft-tissue]
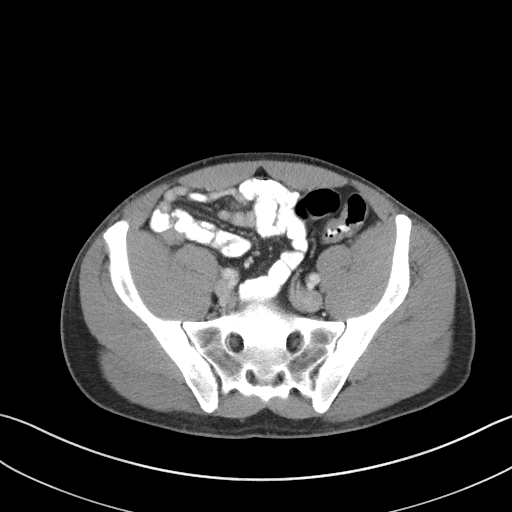
[im 39/88  soft-tissue]
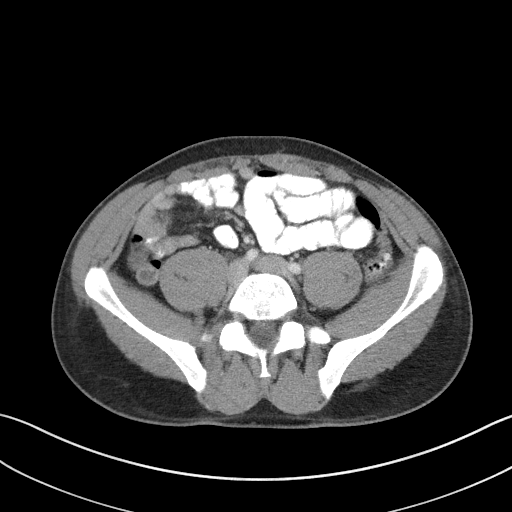
[im 46/88  soft-tissue]
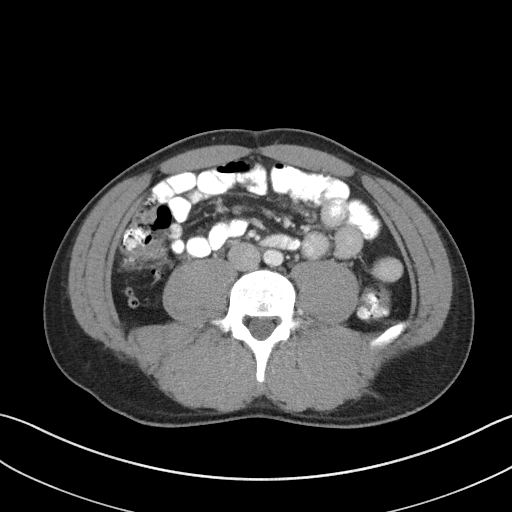
[im 49/88  soft-tissue]
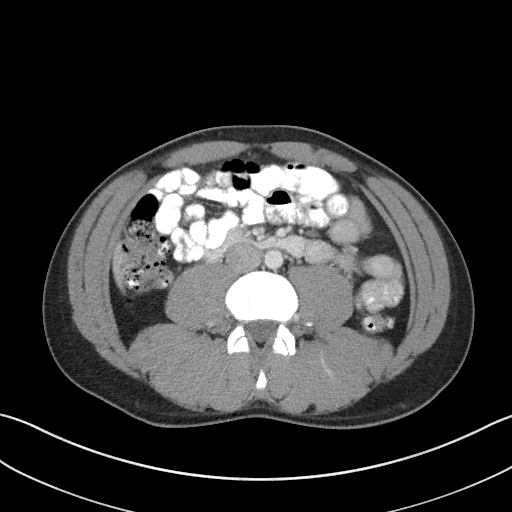
[im 56/88  soft-tissue]
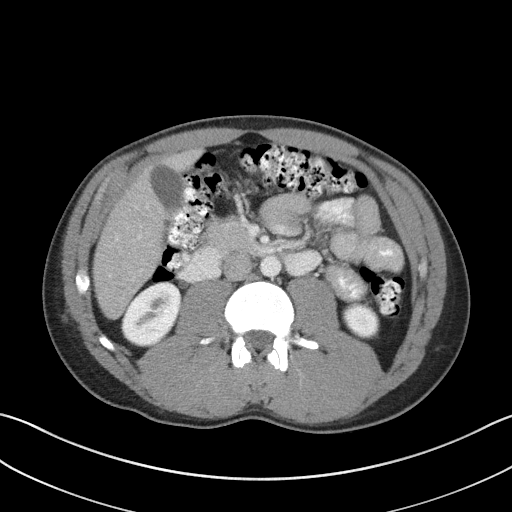
[im 56/88  bone]
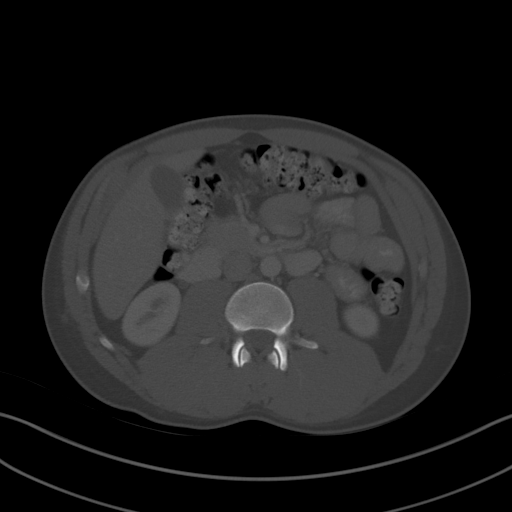
[im 63/88  soft-tissue]
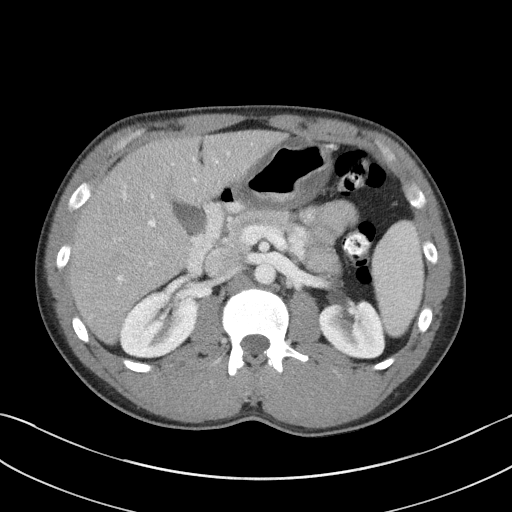
[im 70/88  soft-tissue]
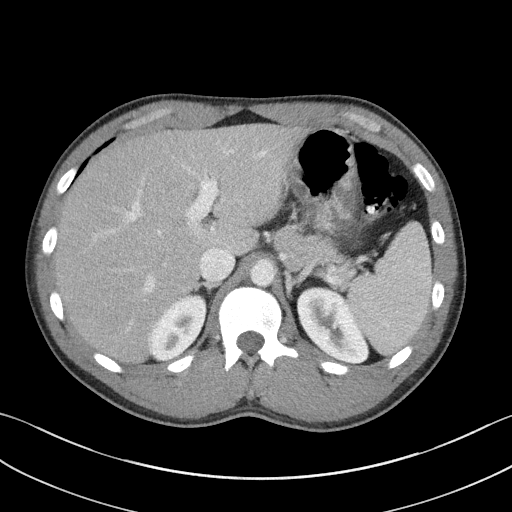
[im 77/88  soft-tissue]
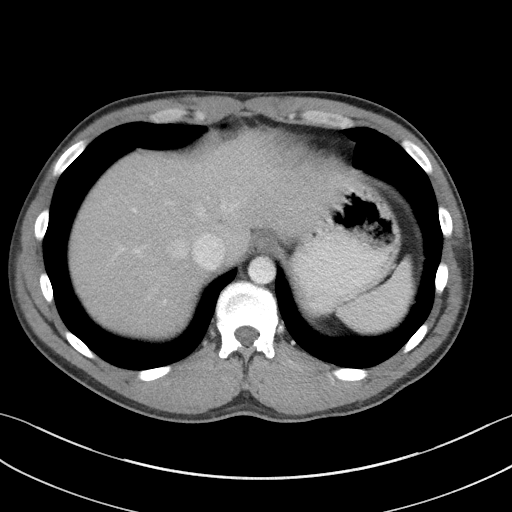
[im 84/88  soft-tissue]
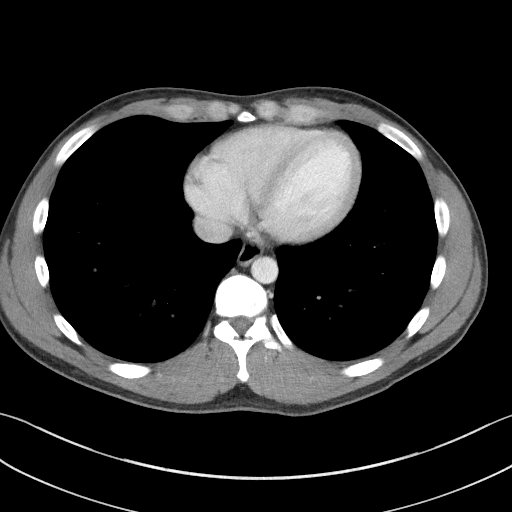

[Series 5: coronal st · coronal · 0.63mm/px · 3 of 71 slices shown]
[im 24/71  soft-tissue]
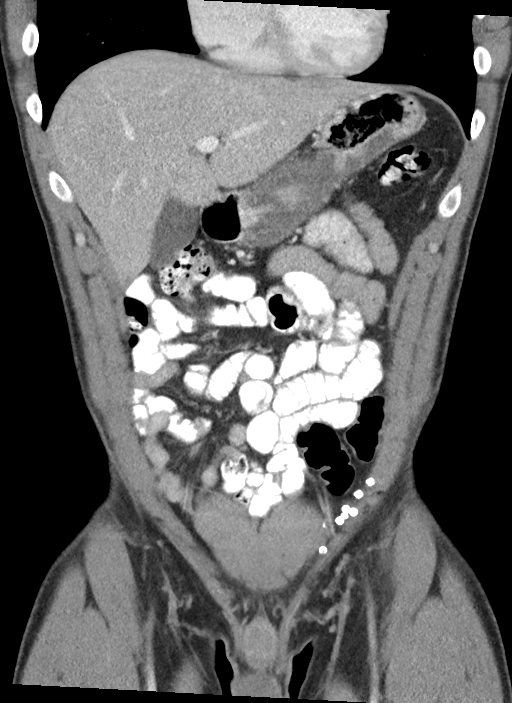
[im 32/71  soft-tissue]
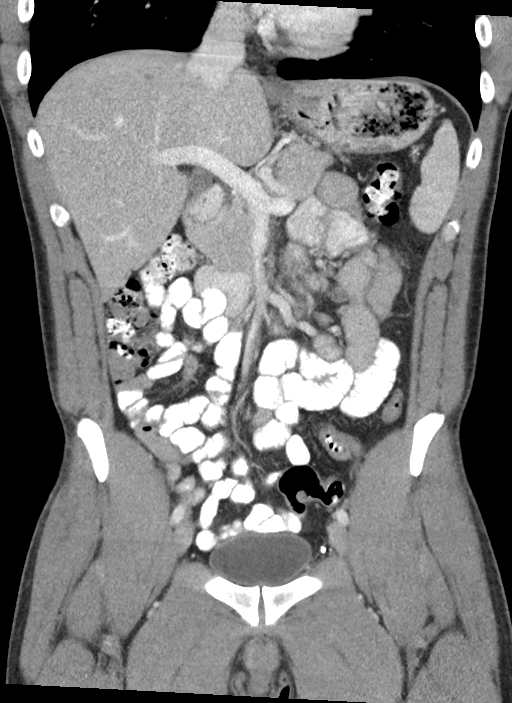
[im 39/71  soft-tissue]
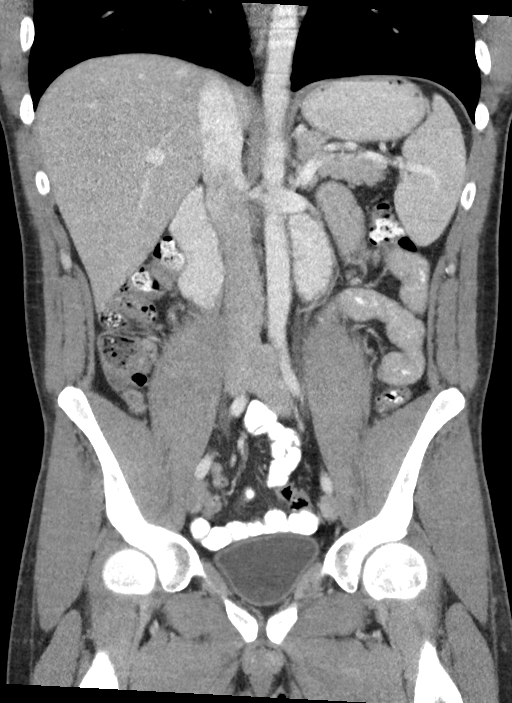

[16 of 46 positions shown; findings below may reference images not displayed]

FINDINGS: Lower chest: No acute abnormality.

Hepatobiliary: Subcentimeter hypodensity in the dome of the liver,
too small to further characterize. No calcified gallstones or
biliary dilatation

Pancreas: Unremarkable. No pancreatic ductal dilatation or
surrounding inflammatory changes.

Spleen: Normal in size without focal abnormality.

Adrenals/Urinary Tract: Adrenal glands are unremarkable. Kidneys are
normal, without renal calculi, focal lesion, or hydronephrosis.
Bladder is unremarkable.

Stomach/Bowel: Stomach is nondistended. Mild hypodense wall
thickening of the gastric antrum and pylorus. Appendix appears
normal. No evidence of bowel wall thickening, distention, or
inflammatory changes.

Vascular/Lymphatic: No significant vascular findings are present. No
enlarged abdominal or pelvic lymph nodes.

Reproductive: Prostate is unremarkable. Probable trace amount of
right hydrocele

Other: Status post left inguinal hernia repair. No evidence for
bowel containing recurrent hernia.

Musculoskeletal: No acute or significant osseous findings.
IMPRESSION: 1. No CT evidence for acute intra-abdominal or pelvic abnormality.
2. Mild hypodense wall thickening of the antral and pyloric region
of the stomach, question gastritis. Follow-up endoscopy as
indicated.
3. Possible trace right hydrocele

## 2020-02-01 DIAGNOSIS — F419 Anxiety disorder, unspecified: Secondary | ICD-10-CM | POA: Diagnosis not present

## 2020-02-07 ENCOUNTER — Encounter: Payer: Self-pay | Admitting: Emergency Medicine

## 2020-02-07 ENCOUNTER — Emergency Department
Admission: EM | Admit: 2020-02-07 | Discharge: 2020-02-07 | Disposition: A | Discharge status: Home / Self Care | Payer: BC Managed Care – PPO | Attending: Emergency Medicine | Admitting: Emergency Medicine

## 2020-02-07 ENCOUNTER — Other Ambulatory Visit: Payer: Self-pay

## 2020-02-07 DIAGNOSIS — F410 Panic disorder [episodic paroxysmal anxiety] without agoraphobia: Secondary | ICD-10-CM | POA: Diagnosis not present

## 2020-02-07 DIAGNOSIS — F419 Anxiety disorder, unspecified: Secondary | ICD-10-CM

## 2020-02-07 DIAGNOSIS — F17290 Nicotine dependence, other tobacco product, uncomplicated: Secondary | ICD-10-CM | POA: Diagnosis not present

## 2020-02-07 MED ORDER — LORAZEPAM 0.5 MG PO TABS: 0.5000 mg | ORAL_TABLET | Freq: Three times a day (TID) | ORAL | 0 refills | Status: AC | PRN

## 2020-02-07 NOTE — ED Triage Notes (Signed)
Pt reports is suffering from panic attacks and was given hydroxyzine but it has made his sx's worse. Pt also takes paroxetine as well and it helps some. Pt reports needs something that works quick. Pt also requesting to get the covid vaccine while he is here

## 2020-02-07 NOTE — ED Provider Notes (Signed)
[facility] Emergency Department Provider Note  ____________________________________________  Time seen: Approximately 7:45 PM  I have reviewed the triage vital signs and the nursing notes.   HISTORY  Chief Complaint Panic Attack    HPI Todd Fleming is a 34 y.o. male who presents the emergency department complaining of increased anxiety and panic attacks. Patient states that he has had a recent job promotion to [employer], but has noticed that his anxiety has been significantly increased. Patient has developed panic attacks as well. He saw urgent care, was started on Vistaril and paroxetine. Patient states that the paroxetine has been improving his symptoms but when he takes the Vistaril at the beginning of panic attacks and makes his panic attack much worse. He is requesting any kind of medication that can help with his panic attacks while he is awaiting his psychiatry appointment on January 6. No suicidal or homicidal ideations. Patient denies any current symptoms other than generalized anxiety. Patient states that he had a really bad anxiety attack earlier this morning prompting him to present to the emergency department for another medication.         History reviewed. No pertinent past medical history.  There are no problems to display for this patient.   Past Surgical History:  Procedure Laterality Date  . HERNIA REPAIR      Prior to Admission medications   Medication Sig Start Date End Date Taking? Authorizing Provider  famotidine (PEPCID) 40 MG tablet Take 1 tablet (40 mg total) by mouth every evening. Patient not taking: Reported on 09/09/2017 05/12/17 05/12/18  Myrna Blazer, MD  HYDROcodone-acetaminophen (NORCO/VICODIN) 5-325 MG tablet Take 1 tablet by mouth every 4 (four) hours as needed. 09/04/17   Minna Antis, MD  LORazepam (ATIVAN) 0.5 MG tablet Take 1 tablet (0.5 mg total) by mouth every 8 (eight) hours as needed for up to 20  doses (Panic Attacks). 02/07/20 02/15/20  Emileo Semel, Delorise Royals, PA-C  omeprazole (PRILOSEC) 40 MG capsule TAKE 1 CAPSULE (40 MG TOTAL) BY MOUTH 2 (TWO) TIMES DAILY BEFORE A MEAL. 11/01/17 12/01/17  Toney Reil, MD  omeprazole (PRILOSEC) 40 MG capsule TAKE 1 CAPSULE (40 MG TOTAL) BY MOUTH 2 (TWO) TIMES DAILY BEFORE A MEAL. 11/14/17 12/14/17  Toney Reil, MD  ondansetron (ZOFRAN ODT) 4 MG disintegrating tablet Take 1 tablet (4 mg total) by mouth every 8 (eight) hours as needed for nausea or vomiting. 09/04/17   Minna Antis, MD  pantoprazole (PROTONIX) 40 MG tablet Take 1 tablet (40 mg total) by mouth daily. 09/04/17 09/04/18  Minna Antis, MD    Allergies Patient has no known allergies.  No family history on file.  Social History Social History   Tobacco Use  . Smoking status: Current Every Day Smoker    Types: E-cigarettes  . Smokeless tobacco: Current User  Substance Use Topics  . Alcohol use: Yes     Review of Systems  Constitutional: No fever/chills Eyes: No visual changes. No discharge ENT: No upper respiratory complaints. Cardiovascular: no chest pain. Respiratory: no cough. No SOB. Gastrointestinal: No abdominal pain.  No nausea, no vomiting.  No diarrhea.  No constipation. Musculoskeletal: Negative for musculoskeletal pain. Skin: Negative for rash, abrasions, lacerations, ecchymosis. Neurological: Negative for headaches, focal weakness or numbness. Psychiatric: Increased anxiety and panic attacks.  No suicidal or homicidal ideation.   10 System ROS otherwise negative.  ____________________________________________   PHYSICAL EXAM:  VITAL SIGNS: ED Triage Vitals  Enc Vitals Group  BP --      Pulse Rate 02/07/20 1747 82     Resp 02/07/20 1747 20     Temp 02/07/20 1747 99 F (37.2 C)     Temp Source 02/07/20 1747 Oral     SpO2 02/07/20 1747 98 %     Weight 02/07/20 1746 150 lb (68 kg)     Height 02/07/20 1746 5\' 9"  (1.753 m)      Head Circumference --      Peak Flow --      Pain Score 02/07/20 1746 0     Pain Loc --      Pain Edu? --      Excl. in GC? --      Constitutional: Alert and oriented. Well appearing and in no acute distress. Eyes: Conjunctivae are normal. PERRL. EOMI. Head: Atraumatic. ENT:      Ears:       Nose: No congestion/rhinnorhea.      Mouth/Throat: Mucous membranes are moist.  Neck: No stridor.    Cardiovascular: Normal rate, regular rhythm. Normal S1 and S2.  Good peripheral circulation. Respiratory: Normal respiratory effort without tachypnea or retractions. Lungs CTAB. Good air entry to the bases with no decreased or absent breath sounds. Musculoskeletal: Full range of motion to all extremities. No gross deformities appreciated. Neurologic:  Normal speech and language. No gross focal neurologic deficits are appreciated.  Skin:  Skin is warm, dry and intact. No rash noted. Psychiatric: Mood and affect are normal. Speech and behavior are normal. Patient exhibits appropriate insight and judgement.  No suicidal or homicidal ideations   ____________________________________________   LABS (all labs ordered are listed, but only abnormal results are displayed)  Labs Reviewed - No data to display ____________________________________________  EKG   ____________________________________________  RADIOLOGY   No results found.  ____________________________________________    PROCEDURES  Procedure(s) performed:    Procedures    Medications - No data to display   ____________________________________________   INITIAL IMPRESSION / ASSESSMENT AND PLAN / ED COURSE  Pertinent labs & imaging results that were available during my care of the patient were reviewed by me and considered in my medical decision making (see chart for details).  Review of the [place] CSRS was performed in accordance of the [facility] prior to dispensing any controlled drugs.           Patient's diagnosis  is consistent with generalized anxiety, increased panic attacks.  Patient presented to emergency department with increased anxiety after job promotion.  Patient has been experiencing panic attacks.  He has been started on Paxil which seems to be improving his symptoms but was also placed on hydroxyzine for as needed attacks.  The hydroxyzine has increased the severity of the panic attack though the Paxil has decreased the frequency of panic attacks.  Patient will be placed on low-dose Ativan until he can see psychiatry in 2 weeks.  Discussed at length the use of benzos for anxiety control and patient understands to only use it for severe panic attack..  Follow-up with psychiatry on 6 January.  Patient is given ED precautions to return to the ED for any worsening or new symptoms.     ____________________________________________  FINAL CLINICAL IMPRESSION(S) / ED DIAGNOSES  Final diagnoses:  Anxiety  Panic attacks      NEW MEDICATIONS STARTED DURING THIS VISIT:  ED Discharge Orders         Ordered    LORazepam (ATIVAN) 0.5 MG tablet  Every 8 hours  PRN        02/07/20 2004              This chart was dictated using voice recognition software/Dragon. Despite best efforts to proofread, errors can occur which can change the meaning. Any change was purely unintentional.    Lanette Hampshire 02/07/20 2010    Phineas Semen, MD 02/07/20 2133

## 2020-02-22 ENCOUNTER — Ambulatory Visit: Payer: BC Managed Care – PPO | Admitting: Adult Health

## 2020-02-22 ENCOUNTER — Encounter: Payer: Self-pay | Admitting: Adult Health

## 2020-02-22 ENCOUNTER — Other Ambulatory Visit: Payer: Self-pay

## 2020-02-22 VITALS — BP 152/97 | HR 83 | Temp 98.0°F | Resp 16 | Ht 68.5 in | Wt 149.4 lb

## 2020-02-22 DIAGNOSIS — E559 Vitamin D deficiency, unspecified: Secondary | ICD-10-CM

## 2020-02-22 DIAGNOSIS — F411 Generalized anxiety disorder: Secondary | ICD-10-CM | POA: Insufficient documentation

## 2020-02-22 DIAGNOSIS — F410 Panic disorder [episodic paroxysmal anxiety] without agoraphobia: Secondary | ICD-10-CM | POA: Diagnosis not present

## 2020-02-22 DIAGNOSIS — Z113 Encounter for screening for infections with a predominantly sexual mode of transmission: Secondary | ICD-10-CM

## 2020-02-22 DIAGNOSIS — F419 Anxiety disorder, unspecified: Secondary | ICD-10-CM

## 2020-02-22 LAB — POCT URINALYSIS DIPSTICK
Bilirubin, UA: NEGATIVE
Blood, UA: NEGATIVE
Clarity, UA: NEGATIVE
Glucose, UA: NEGATIVE
Ketones, UA: NEGATIVE
Leukocytes, UA: NEGATIVE
Nitrite, UA: NEGATIVE
Protein, UA: NEGATIVE
Spec Grav, UA: 1.01 (ref 1.010–1.025)
Urobilinogen, UA: 0.2 E.U./dL
pH, UA: 7.5 (ref 5.0–8.0)

## 2020-02-22 MED ORDER — PAROXETINE HCL 20 MG PO TABS: 20.0000 mg | ORAL_TABLET | Freq: Every day | ORAL | 0 refills | Status: DC

## 2020-02-22 MED ORDER — LORAZEPAM 0.5 MG PO TABS: 0.5000 mg | ORAL_TABLET | Freq: Three times a day (TID) | ORAL | 0 refills | Status: DC

## 2020-02-22 NOTE — Patient Instructions (Signed)
Heat Rash, Adult  Heat rash is an itchy rash of little red bumps that often occurs during hot, humid weather. Heat rash is also called prickly heat or miliaria. Heat rash usually affects:  Armpits.  Elbows.  Groin.  Neck.  The area underneath the breasts.  Shoulders.  Chest. What are the causes? This condition is caused by blocked sweat ducts. When sweat is trapped under the skin, it spreads into surrounding tissues and causes a rash of red bumps. What increases the risk? This condition is more likely to develop in people who:  Are overdressed in hot, humid weather.  Wear clothing that rubs against the skin.  Are active in hot, humid weather.  Sweat a lot.  Are not used to hot, humid weather. What are the signs or symptoms? Symptoms of this condition include:  Small red bumps that are itchy or prickly.  Very little sweating or no sweating in the affected area. How is this diagnosed? This condition is diagnosed based on your symptoms and medical history, as well as a physical exam. How is this treated? Moving to a cool, dry place is the best treatment for heat rash. Treatment may also include medicines, such as:  Corticosteroid creams for skin irritation.  Antibiotic medicines, if the rash becomes infected. Follow these instructions at home: Skin care  Keep the affected area dry.  Do not apply ointments or creams that contain mineral oil or petroleum ingredients to your skin. These can make the condition worse.  Apply cool compresses to the affected areas.  Do not scratch your skin.  Do not take hot showers or baths. General instructions  Take over-the-counter and prescription medicines only as told by your health care provider.  If you were prescribed an antibiotic, take it as told by your health care provider. Do not stop taking it even if your condition improves.  Stay in a cool room as much as possible. Use an air conditioner or fan, if  possible.  Do not wear tight clothes. Wear comfortable, loose-fitting clothing.  Keep all follow-up visits as told by your health care provider. This is important. Contact a health care provider if:  You have a fever.  Your rash does not go away after 3-4 days.  Your rash gets worse or it is very itchy.  Your rash has pus or fluid coming from it. Get help right away if:  You are dizzy or nauseated.  You feel confused.  You have trouble breathing.  You have chest pain.  You have muscle cramps or contractions.  You faint. Summary  Heat rash is an itchy rash of little red bumps that often occurs during hot, humid weather.  Symptoms of heat rash include small red bumps that are itchy or prickly and very little or no sweating in the affected area.  This condition is diagnosed based on your symptoms and medical history, as well as a physical exam.  Moving to a cool, dry place is the best treatment for heat rash.  Do not wear tight clothes. Wear comfortable, loose-fitting clothing. This information is not intended to replace advice given to you by your health care provider. Make sure you discuss any questions you have with your health care provider. Document Revised: 01/14/2017 Document Reviewed: 04/14/2016 Elsevier Patient Education  2020 [employer] Health Maintenance, Male Adopting a healthy lifestyle and getting preventive care are important in promoting health and wellness. Ask your health care provider about:  The right schedule for you to have  regular tests and exams.  Things you can do on your own to prevent diseases and keep yourself healthy. What should I know about diet, weight, and exercise? Eat a healthy diet   Eat a diet that includes plenty of vegetables, fruits, low-fat dairy products, and lean protein.  Do not eat a lot of foods that are high in solid fats, added sugars, or sodium. Maintain a healthy weight Body mass index (BMI) is a measurement  that can be used to identify possible weight problems. It estimates body fat based on height and weight. Your health care provider can help determine your BMI and help you achieve or maintain a healthy weight. Get regular exercise Get regular exercise. This is one of the most important things you can do for your health. Most adults should:  Exercise for at least 150 minutes each week. The exercise should increase your heart rate and make you sweat (moderate-intensity exercise).  Do strengthening exercises at least twice a week. This is in addition to the moderate-intensity exercise.  Spend less time sitting. Even light physical activity can be beneficial. Watch cholesterol and blood lipids Have your blood tested for lipids and cholesterol at 36 years of age, then have this test every 5 years. You may need to have your cholesterol levels checked more often if:  Your lipid or cholesterol levels are high.  You are older than 36 years of age.  You are at high risk for heart disease. What should I know about cancer screening? Many types of cancers can be detected early and may often be prevented. Depending on your health history and family history, you may need to have cancer screening at various ages. This may include screening for:  Colorectal cancer.  Prostate cancer.  Skin cancer.  Lung cancer. What should I know about heart disease, diabetes, and high blood pressure? Blood pressure and heart disease  High blood pressure causes heart disease and increases the risk of stroke. This is more likely to develop in people who have high blood pressure readings, are of African descent, or are overweight.  Talk with your health care provider about your target blood pressure readings.  Have your blood pressure checked: ? Every 3-5 years if you are 38-97 years of age. ? Every year if you are 32 years old or older.  If you are between the ages of 78 and 67 and are a current or former smoker,  ask your health care provider if you should have a one-time screening for abdominal aortic aneurysm (AAA). Diabetes Have regular diabetes screenings. This checks your fasting blood sugar level. Have the screening done:  Once every three years after age 30 if you are at a normal weight and have a low risk for diabetes.  More often and at a younger age if you are overweight or have a high risk for diabetes. What should I know about preventing infection? Hepatitis B If you have a higher risk for hepatitis B, you should be screened for this virus. Talk with your health care provider to find out if you are at risk for hepatitis B infection. Hepatitis C Blood testing is recommended for:  Everyone born from [birth year] through 1966.  Anyone with known risk factors for hepatitis C. Sexually transmitted infections (STIs)  You should be screened each year for STIs, including gonorrhea and chlamydia, if: ? You are sexually active and are younger than 36 years of age. ? You are older than 36 years of age and your  health care provider tells you that you are at risk for this type of infection. ? Your sexual activity has changed since you were last screened, and you are at increased risk for chlamydia or gonorrhea. Ask your health care provider if you are at risk.  Ask your health care provider about whether you are at high risk for HIV. Your health care provider may recommend a prescription medicine to help prevent HIV infection. If you choose to take medicine to prevent HIV, you should first get tested for HIV. You should then be tested every 3 months for as long as you are taking the medicine. Follow these instructions at home: Lifestyle  Do not use any products that contain nicotine or tobacco, such as cigarettes, e-cigarettes, and chewing tobacco. If you need help quitting, ask your health care provider.  Do not use street drugs.  Do not share needles.  Ask your health care provider for help if you  need support or information about quitting drugs. Alcohol use  Do not drink alcohol if your health care provider tells you not to drink.  If you drink alcohol: ? Limit how much you have to 0-2 drinks a day. ? Be aware of how much alcohol is in your drink. In the [place] one drink equals one 12 oz bottle of beer (355 mL), one 5 oz glass of wine (148 mL), or one 1 oz glass of hard liquor (44 mL). General instructions  Schedule regular health, dental, and eye exams.  Stay current with your vaccines.  Tell your health care provider if: ? You often feel depressed. ? You have ever been abused or do not feel safe at home. Summary  Adopting a healthy lifestyle and getting preventive care are important in promoting health and wellness.  Follow your health care provider's instructions about healthy diet, exercising, and getting tested or screened for diseases.  Follow your health care provider's instructions on monitoring your cholesterol and blood pressure. This information is not intended to replace advice given to you by your health care provider. Make sure you discuss any questions you have with your health care provider. Document Revised: 01/25/2018 Document Reviewed: 01/25/2018 Elsevier Patient Education  2020 [employer] Inc. Lorazepam tablets What is this medicine? LORAZEPAM (lor A ze pam) is a benzodiazepine. It is used to treat anxiety. This medicine may be used for other purposes; ask your health care provider or pharmacist if you have questions. COMMON BRAND NAME(S): Ativan What should I tell my health care provider before I take this medicine? They need to know if you have any of these conditions:  glaucoma  history of drug or alcohol abuse problem  kidney disease  liver disease  lung or breathing disease, like asthma  mental illness  myasthenia gravis  Parkinson's disease  suicidal thoughts, plans, or attempt; a previous suicide attempt by you or a family  member  an unusual or allergic reaction to lorazepam, other medicines, foods, dyes, or preservatives  pregnant or trying to get pregnant  breast-feeding How should I use this medicine? Take this medicine by mouth with a glass of water. Follow the directions on the prescription label. Take your medicine at regular intervals. Do not take it more often than directed. Do not stop taking except on your doctor's advice. A special MedGuide will be given to you by the pharmacist with each prescription and refill. Be sure to read this information carefully each time. Talk to your pediatrician regarding the use of this medicine in  children. While this drug may be used in children as young as 12 years for selected conditions, precautions do apply. Overdosage: If you think you have taken too much of this medicine contact a poison control center or emergency room at once. NOTE: This medicine is only for you. Do not share this medicine with others. What if I miss a dose? If you miss a dose, take it as soon as you can. If it is almost time for your next dose, take only that dose. Do not take double or extra doses. What may interact with this medicine? Do not take this medicine with any of the following medications:  narcotic medicines for cough  sodium oxybate This medicine may also interact with the following medications:  alcohol  antihistamines for allergy, cough and cold  certain medicines for anxiety or sleep  certain medicines for depression, like amitriptyline, fluoxetine, sertraline  certain medicines for seizures like carbamazepine, phenobarbital, phenytoin, primidone  general anesthetics like lidocaine, pramoxine, tetracaine  MAOIs like Carbex, Eldepryl, Marplan, Nardil, and Parnate  medicines that relax muscles for surgery  narcotic medicines for pain  phenothiazines like chlorpromazine, mesoridazine, prochlorperazine, thioridazine This list may not describe all possible  interactions. Give your health care provider a list of all the medicines, herbs, non-prescription drugs, or dietary supplements you use. Also tell them if you smoke, drink alcohol, or use illegal drugs. Some items may interact with your medicine. What should I watch for while using this medicine? Tell your doctor or health care professional if your symptoms do not start to get better or if they get worse. Do not stop taking except on your doctor's advice. You may develop a severe reaction. Your doctor will tell you how much medicine to take. You may get drowsy or dizzy. Do not drive, use machinery, or do anything that needs mental alertness until you know how this medicine affects you. To reduce the risk of dizzy and fainting spells, do not stand or sit up quickly, especially if you are an older patient. Alcohol may increase dizziness and drowsiness. Avoid alcoholic drinks. If you are taking another medicine that also causes drowsiness, you may have more side effects. Give your health care provider a list of all medicines you use. Your doctor will tell you how much medicine to take. Do not take more medicine than directed. Call emergency for help if you have problems breathing or unusual sleepiness. What side effects may I notice from receiving this medicine? Side effects that you should report to your doctor or health care professional as soon as possible:  allergic reactions like skin rash, itching or hives, swelling of the face, lips, or tongue  breathing problems  confusion  loss of balance or coordination  signs and symptoms of low blood pressure like dizziness; feeling faint or lightheaded, falls; unusually weak or tired  suicidal thoughts or other mood changes Side effects that usually do not require medical attention (report to your doctor or health care professional if they continue or are bothersome):  dizziness  headache  nausea, vomiting  tiredness This list may not describe  all possible side effects. Call your doctor for medical advice about side effects. You may report side effects to FDA at 1-800-FDA-1088. Where should I keep my medicine? Keep out of the reach of children. This medicine can be abused. Keep your medicine in a safe place to protect it from theft. Do not share this medicine with anyone. Selling or giving away this medicine is dangerous  and against the law. This medicine may cause accidental overdose and death if taken by other adults, children, or pets. Mix any unused medicine with a substance like cat litter or coffee grounds. Then throw the medicine away in a sealed container like a sealed bag or a coffee can with a lid. Do not use the medicine after the expiration date. Store at room temperature between 20 and 25 degrees C (68 and 77 degrees F). Protect from light. Keep container tightly closed. NOTE: This sheet is a summary. It may not cover all possible information. If you have questions about this medicine, talk to your doctor, pharmacist, or health care provider.  2020 Elsevier/Gold Standard (2014-10-31 15:54:27) Paroxetine tablets What is this medicine? PAROXETINE (pa ROX e teen) is used to treat depression. It may also be used to treat anxiety disorders, obsessive compulsive disorder, panic attacks, post traumatic stress, and premenstrual dysphoric disorder (PMDD). This medicine may be used for other purposes; ask your health care provider or pharmacist if you have questions. COMMON BRAND NAME(S): Paxil, Pexeva What should I tell my health care provider before I take this medicine? They need to know if you have any of these conditions:  bipolar disorder or a family history of bipolar disorder  bleeding disorders  glaucoma  heart disease  kidney disease  liver disease  low levels of sodium in the blood  seizures  suicidal thoughts, plans, or attempt; a previous suicide attempt by you or a family member  take MAOIs like Carbex,  Eldepryl, Marplan, Nardil, and Parnate  take medicines that treat or prevent blood clots  thyroid disease  an unusual or allergic reaction to paroxetine, other medicines, foods, dyes, or preservatives  pregnant or trying to get pregnant  breast-feeding How should I use this medicine? Take this medicine by mouth with a glass of water. Follow the directions on the prescription label. You can take it with or without food. Take your medicine at regular intervals. Do not take your medicine more often than directed. Do not stop taking this medicine suddenly except upon the advice of your doctor. Stopping this medicine too quickly may cause serious side effects or your condition may worsen. A special MedGuide will be given to you by the pharmacist with each prescription and refill. Be sure to read this information carefully each time. Talk to your pediatrician regarding the use of this medicine in children. Special care may be needed. Overdosage: If you think you have taken too much of this medicine contact a poison control center or emergency room at once. NOTE: This medicine is only for you. Do not share this medicine with others. What if I miss a dose? If you miss a dose, take it as soon as you can. If it is almost time for your next dose, take only that dose. Do not take double or extra doses. What may interact with this medicine? Do not take this medicine with any of the following medications:  linezolid  MAOIs like Carbex, Eldepryl, Marplan, Nardil, and Parnate  methylene blue (injected into a vein)  pimozide  thioridazine This medicine may also interact with the following medications:  alcohol  amphetamines  aspirin and aspirin-like medicines  atomoxetine  certain medicines for depression, anxiety, or psychotic disturbances  certain medicines for irregular heart beat like propafenone, flecainide, encainide, and quinidine  certain medicines for migraine headache like  almotriptan, eletriptan, frovatriptan, naratriptan, rizatriptan, sumatriptan, zolmitriptan  cimetidine  digoxin  diuretics  fentanyl  fosamprenavir  furazolidone  isoniazid  lithium  medicines that treat or prevent blood clots like warfarin, enoxaparin, and dalteparin  medicines for sleep  NSAIDs, medicines for pain and inflammation, like ibuprofen or naproxen  phenobarbital  phenytoin  procarbazine  rasagiline  ritonavir  supplements like St. John's wort, kava kava, valerian  tamoxifen  tramadol  tryptophan This list may not describe all possible interactions. Give your health care provider a list of all the medicines, herbs, non-prescription drugs, or dietary supplements you use. Also tell them if you smoke, drink alcohol, or use illegal drugs. Some items may interact with your medicine. What should I watch for while using this medicine? Tell your doctor if your symptoms do not get better or if they get worse. Visit your doctor or health care professional for regular checks on your progress. Because it may take several weeks to see the full effects of this medicine, it is important to continue your treatment as prescribed by your doctor. Patients and their families should watch out for new or worsening thoughts of suicide or depression. Also watch out for sudden changes in feelings such as feeling anxious, agitated, panicky, irritable, hostile, aggressive, impulsive, severely restless, overly excited and hyperactive, or not being able to sleep. If this happens, especially at the beginning of treatment or after a change in dose, call your health care professional. Dennis Bast may get drowsy or dizzy. Do not drive, use machinery, or do anything that needs mental alertness until you know how this medicine affects you. Do not stand or sit up quickly, especially if you are an older patient. This reduces the risk of dizzy or fainting spells. Alcohol may interfere with the effect of  this medicine. Avoid alcoholic drinks. Your mouth may get dry. Chewing sugarless gum or sucking hard candy, and drinking plenty of water will help. Contact your doctor if the problem does not go away or is severe. What side effects may I notice from receiving this medicine? Side effects that you should report to your doctor or health care professional as soon as possible:  allergic reactions like skin rash, itching or hives, swelling of the face, lips, or tongue  anxious  black, tarry stools  changes in vision  confusion  elevated mood, decreased need for sleep, racing thoughts, impulsive behavior  eye pain  fast, irregular heartbeat  feeling faint or lightheaded, falls  feeling agitated, angry, or irritable  hallucination, loss of contact with reality  loss of balance or coordination  loss of memory  painful or prolonged erections  restlessness, pacing, inability to keep still  seizures  stiff muscles  suicidal thoughts or other mood changes  trouble sleeping  unusual bleeding or bruising  unusually weak or tired  vomiting Side effects that usually do not require medical attention (report to your doctor or health care professional if they continue or are bothersome):  change in appetite or weight  change in sex drive or performance  diarrhea  dizziness  dry mouth  increased sweating  indigestion, nausea  tired  tremors This list may not describe all possible side effects. Call your doctor for medical advice about side effects. You may report side effects to FDA at 1-800-FDA-1088. Where should I keep my medicine? Keep out of the reach of children. Store at room temperature between 15 and 30 degrees C (59 and 86 degrees F). Keep container tightly closed. Throw away any unused medicine after the expiration date. NOTE: This sheet is a summary. It may not cover all possible information.  If you have questions about this medicine, talk to your doctor,  pharmacist, or health care provider.  2020 Elsevier/Gold Standard (2015-07-05 15:50:32) Generalized Anxiety Disorder, Adult Generalized anxiety disorder (GAD) is a mental health disorder. People with this condition constantly worry about everyday events. Unlike normal anxiety, worry related to GAD is not triggered by a specific event. These worries also do not fade or get better with time. GAD interferes with life functions, including relationships, work, and school. GAD can vary from mild to severe. People with severe GAD can have intense waves of anxiety with physical symptoms (panic attacks). What are the causes? The exact cause of GAD is not known. What increases the risk? This condition is more likely to develop in:  Women.  People who have a family history of anxiety disorders.  People who are very shy.  People who experience very stressful life events, such as the death of a loved one.  People who have a very stressful family environment. What are the signs or symptoms? People with GAD often worry excessively about many things in their lives, such as their health and family. They may also be overly concerned about:  Doing well at work.  Being on time.  Natural disasters.  Friendships. Physical symptoms of GAD include:  Fatigue.  Muscle tension or having muscle twitches.  Trembling or feeling shaky.  Being easily startled.  Feeling like your heart is pounding or racing.  Feeling out of breath or like you cannot take a deep breath.  Having trouble falling asleep or staying asleep.  Sweating.  Nausea, diarrhea, or irritable bowel syndrome (IBS).  Headaches.  Trouble concentrating or remembering facts.  Restlessness.  Irritability. How is this diagnosed? Your health care provider can diagnose GAD based on your symptoms and medical history. You will also have a physical exam. The health care provider will ask specific questions about your symptoms,  including how severe they are, when they started, and if they come and go. Your health care provider may ask you about your use of alcohol or drugs, including prescription medicines. Your health care provider may refer you to a mental health specialist for further evaluation. Your health care provider will do a thorough examination and may perform additional tests to rule out other possible causes of your symptoms. To be diagnosed with GAD, a person must have anxiety that:  Is out of his or her control.  Affects several different aspects of his or her life, such as work and relationships.  Causes distress that makes him or her unable to take part in normal activities.  Includes at least three physical symptoms of GAD, such as restlessness, fatigue, trouble concentrating, irritability, muscle tension, or sleep problems. Before your health care provider can confirm a diagnosis of GAD, these symptoms must be present more days than they are not, and they must last for six months or longer. How is this treated? The following therapies are usually used to treat GAD:  Medicine. Antidepressant medicine is usually prescribed for long-term daily control. Antianxiety medicines may be added in severe cases, especially when panic attacks occur.  Talk therapy (psychotherapy). Certain types of talk therapy can be helpful in treating GAD by providing support, education, and guidance. Options include: ? Cognitive behavioral therapy (CBT). People learn coping skills and techniques to ease their anxiety. They learn to identify unrealistic or negative thoughts and behaviors and to replace them with positive ones. ? Acceptance and commitment therapy (ACT). This treatment teaches people how to  be mindful as a way to cope with unwanted thoughts and feelings. ? Biofeedback. This process trains you to manage your body's response (physiological response) through breathing techniques and relaxation methods. You will work  with a therapist while machines are used to monitor your physical symptoms.  Stress management techniques. These include yoga, meditation, and exercise. A mental health specialist can help determine which treatment is best for you. Some people see improvement with one type of therapy. However, other people require a combination of therapies. Follow these instructions at home:  Take over-the-counter and prescription medicines only as told by your health care provider.  Try to maintain a normal routine.  Try to anticipate stressful situations and allow extra time to manage them.  Practice any stress management or self-calming techniques as taught by your health care provider.  Do not punish yourself for setbacks or for not making progress.  Try to recognize your accomplishments, even if they are small.  Keep all follow-up visits as told by your health care provider. This is important. Contact a health care provider if:  Your symptoms do not get better.  Your symptoms get worse.  You have signs of depression, such as: ? A persistently sad, cranky, or irritable mood. ? Loss of enjoyment in activities that used to bring you joy. ? Change in weight or eating. ? Changes in sleeping habits. ? Avoiding friends or family members. ? Loss of energy for normal tasks. ? Feelings of guilt or worthlessness. Get help right away if:  You have serious thoughts about hurting yourself or others. If you ever feel like you may hurt yourself or others, or have thoughts about taking your own life, get help right away. You can go to your nearest emergency department or call:  Your local emergency services (911 in the [place]  A suicide crisis helpline, such as the National Suicide Prevention Lifeline at (431)396-9949. This is open 24 hours a day. Summary  Generalized anxiety disorder (GAD) is a mental health disorder that involves worry that is not triggered by a specific event.  People with GAD  often worry excessively about many things in their lives, such as their health and family.  GAD may cause physical symptoms such as restlessness, trouble concentrating, sleep problems, frequent sweating, nausea, diarrhea, headaches, and trembling or muscle twitching.  A mental health specialist can help determine which treatment is best for you. Some people see improvement with one type of therapy. However, other people require a combination of therapies. This information is not intended to replace advice given to you by your health care provider. Make sure you discuss any questions you have with your health care provider. Document Revised: 01/14/2017 Document Reviewed: 12/23/2015 Elsevier Patient Education  2020 [employer] Hyperventilation Hyperventilation is a condition that happens when you breathe faster and more deeply than usual. An episode of hyperventilation usually lasts 20-30 minutes. During an episode, you may feel breathless, and your hands, feet, or mouth may tingle, spasm, or feel numb. Hyperventilation is usually triggered by stress, anxiety, or emotions. However, it can also be a sign of another condition, such as:  A lung problem, including emphysema or asthma.  An infection.  Heart problems.  Pregnancy.  Bleeding. Follow these instructions at home:   Learn and use deep breathing exercises that help you breathe from your diaphragm and abdomen.  Practice relaxation techniques to reduce stress, such as visualization, meditation, or gentle yoga.  If you are hyperventilating, breathe into a paper bag. This  slows down breathing. Contact a health care provider if:  You continue to have episodes of hyperventilation.  Your hyperventilation gets worse. Get help right away if:  You pass out due to hyperventilation.  You have continued numbness after a period of hyperventilation. Summary  Hyperventilation is breathing more deeply and more rapidly than  normal.  During an episode, you may feel breathless, and your hands, feet, or mouth may tingle, spasm, or feel numb.  If you are hyperventilating, try breathing into a paper bag. This slows down breathing. This information is not intended to replace advice given to you by your health care provider. Make sure you discuss any questions you have with your health care provider. Document Revised: 07/04/2017 Document Reviewed: 07/04/2017 Elsevier Patient Education  2020 [employer].

## 2020-02-22 NOTE — Progress Notes (Signed)
New patient visit   Patient: Todd Fleming   DOB: Sep 27, 1985   35 y.o. Male  MRN: [id number] Visit Date: 02/22/2020  Today's healthcare provider: Marcille Buffy, FNP   Chief Complaint  Patient presents with  . New Patient (Initial Visit)   Subjective    Todd Fleming is a 35 y.o. male who presents today as a new patient to establish care.  HPI  Patient reports that he feels fairly well today but would like to address concerns of anxiety and frequent panic attacks. Patient reports that his diet has been poor due to decreased appetite and states that his sleep patters are fairly poor waking up throughout the night. Patient reports that he is staying active with daily work duties.   He has had anxiety attacks recently and was seeb 02/06/21 at ED was given lorazepam.  He is on Paroxetine 10mg  qd.   Not taking Hydroxzine.  makes him feel well. He does feel better on the paxil than he did.  Panic attacks have decreased he is having 1- 2 now was having 3- 4 .   He denies any suicidal or homicidal ideations.   Patient  denies any fever, body aches,chills, rash, chest pain, shortness of breath, nausea, vomiting, or diarrhea.   Denies dizziness, lightheadedness, pre syncopal or syncopal episodes.    History reviewed. No pertinent past medical history. Past Surgical History:  Procedure Laterality Date  . HERNIA REPAIR     No family status information on file.   History reviewed. No pertinent family history. Social History   Socioeconomic History  . Marital status: Married    Spouse name: Not on file  . Number of children: Not on file  . Years of education: Not on file  . Highest education level: Not on file  Occupational History  . Not on file  Tobacco Use  . Smoking status: Current Every Day Smoker    Types: E-cigarettes  . Smokeless tobacco: Current User  Substance and Sexual Activity  . Alcohol use: Yes  . Drug use: Not on file  . Sexual activity:  Not on file  Other Topics Concern  . Not on file  Social History Narrative  . Not on file   Social Determinants of Health   Financial Resource Strain: Not on file  Food Insecurity: Not on file  Transportation Needs: Not on file  Physical Activity: Not on file  Stress: Not on file  Social Connections: Not on file   Outpatient Medications Prior to Visit  Medication Sig  . [DISCONTINUED] LORazepam (ATIVAN) 0.5 MG tablet Take 0.5 mg by mouth every 8 (eight) hours.  . [DISCONTINUED] PARoxetine (PAXIL) 10 MG tablet Take 10 mg by mouth daily.  Marland Kitchen HYDROcodone-acetaminophen (NORCO/VICODIN) 5-325 MG tablet Take 1 tablet by mouth every 4 (four) hours as needed. (Patient not taking: Reported on 02/22/2020)  . [DISCONTINUED] famotidine (PEPCID) 40 MG tablet Take 1 tablet (40 mg total) by mouth every evening. (Patient not taking: Reported on 09/09/2017)  . [DISCONTINUED] omeprazole (PRILOSEC) 40 MG capsule TAKE 1 CAPSULE (40 MG TOTAL) BY MOUTH 2 (TWO) TIMES DAILY BEFORE A MEAL.  . [DISCONTINUED] omeprazole (PRILOSEC) 40 MG capsule TAKE 1 CAPSULE (40 MG TOTAL) BY MOUTH 2 (TWO) TIMES DAILY BEFORE A MEAL.  . [DISCONTINUED] ondansetron (ZOFRAN ODT) 4 MG disintegrating tablet Take 1 tablet (4 mg total) by mouth every 8 (eight) hours as needed for nausea or vomiting. (Patient not taking: Reported on 02/22/2020)  . [DISCONTINUED]  pantoprazole (PROTONIX) 40 MG tablet Take 1 tablet (40 mg total) by mouth daily.   No facility-administered medications prior to visit.   No Known Allergies   There is no immunization history on file for this patient.  Health Maintenance  Topic Date Due  . COVID-19 Vaccine (1) Never done  . TETANUS/TDAP  Never done  . INFLUENZA VACCINE  Never done  . Hepatitis C Screening  Completed  . HIV Screening  Completed    Patient Care Team: Flinchum, Eula Fried, FNP as PCP - General (Family Medicine)  Review of Systems  All other systems reviewed and are negative.   Last  CBC Lab Results  Component Value Date   WBC 7.8 02/22/2020   HGB 15.6 02/22/2020   HCT 47.8 02/22/2020   MCV 82 02/22/2020   MCH 26.7 02/22/2020   RDW 12.1 02/22/2020   PLT 360 02/22/2020   Last metabolic panel Lab Results  Component Value Date   GLUCOSE 66 02/22/2020   NA 140 02/22/2020   K 5.3 (H) 02/22/2020   CL 100 02/22/2020   CO2 25 02/22/2020   BUN 13 02/22/2020   CREATININE 1.03 02/22/2020   GFRNONAA 94 02/22/2020   GFRAA 108 02/22/2020   CALCIUM 10.0 02/22/2020   PROT 7.1 02/22/2020   ALBUMIN 4.9 02/22/2020   LABGLOB 2.2 02/22/2020   AGRATIO 2.2 02/22/2020   BILITOT 0.5 02/22/2020   ALKPHOS 130 (H) 02/22/2020   AST 17 02/22/2020   ALT 20 02/22/2020   ANIONGAP 10 09/04/2017   Last lipids Lab Results  Component Value Date   CHOL 154 02/22/2020   HDL 58 02/22/2020   LDLCALC 83 02/22/2020   TRIG 62 02/22/2020   CHOLHDL 2.7 02/22/2020   Last hemoglobin A1c No results found for: HGBA1C Last thyroid functions Lab Results  Component Value Date   TSH 0.605 02/22/2020   Last vitamin D Lab Results  Component Value Date   VD25OH 32.8 02/22/2020   Last vitamin B12 and Folate No results found for: VITAMINB12, FOLATE    Objective    BP (!) 152/97   Pulse 83   Temp 98 F (36.7 C) (Oral)   Resp 16   Ht 5' 8.5" (1.74 m)   Wt 149 lb 6.4 oz (67.8 kg)   SpO2 100%   BMI 22.39 kg/m  Physical Exam Vitals and nursing note reviewed.  Constitutional:      General: He is active. He is not in acute distress.    Appearance: Normal appearance. He is well-developed and well-nourished. He is not ill-appearing, toxic-appearing, sickly-appearing or diaphoretic.     Comments: Patient is alert and oriented and responsive to questions Engages in eye contact with provider. Speaks in full sentences without any pauses without any shortness of breath or distress.    HENT:     Head: Normocephalic and atraumatic.     Right Ear: Hearing, tympanic membrane, ear canal and  external ear normal.     Left Ear: Hearing, tympanic membrane, ear canal and external ear normal.     Nose: Nose normal.     Mouth/Throat:     Mouth: Oropharynx is clear and moist and mucous membranes are normal.     Pharynx: Uvula midline. No oropharyngeal exudate.  Eyes:     General: Lids are normal. No scleral icterus.       Right eye: No discharge.        Left eye: No discharge.     Extraocular Movements: EOM normal.  Conjunctiva/sclera: Conjunctivae normal.     Pupils: Pupils are equal, round, and reactive to light.  Neck:     Thyroid: No thyromegaly.     Vascular: Normal carotid pulses. No carotid bruit, hepatojugular reflux or JVD.     Trachea: Trachea and phonation normal. No tracheal tenderness or tracheal deviation.     Meningeal: Brudzinski's sign absent.  Cardiovascular:     Rate and Rhythm: Normal rate and regular rhythm.     Pulses: Normal pulses and intact distal pulses.     Heart sounds: Normal heart sounds, S1 normal and S2 normal. Heart sounds not distant. No murmur heard. No friction rub. No gallop.   Pulmonary:     Effort: Pulmonary effort is normal. No accessory muscle usage or respiratory distress.     Breath sounds: Normal breath sounds. No stridor. No wheezing or rales.  Chest:     Chest wall: No tenderness.  Abdominal:     General: Bowel sounds are normal. There is no distension. Aorta is normal.     Palpations: Abdomen is soft. There is no mass.     Tenderness: There is no abdominal tenderness. There is no guarding or rebound.     Hernia: No hernia is present.  Musculoskeletal:        General: No tenderness, deformity or edema. Normal range of motion.     Cervical back: Full passive range of motion without pain, normal range of motion and neck supple.     Comments: Patient moves on and off of exam table and in room without difficulty. Gait is normal in hall and in room. Patient is oriented to person place time and situation. Patient answers questions  appropriately and engages in conversation.   Lymphadenopathy:     Head:     Right side of head: No submental, submandibular, tonsillar, preauricular, posterior auricular or occipital adenopathy.     Left side of head: No submental, submandibular, tonsillar, preauricular, posterior auricular or occipital adenopathy.     Cervical: No cervical adenopathy.     Upper Body:  No axillary adenopathy present. Skin:    General: Skin is warm, dry and intact.     Capillary Refill: Capillary refill takes less than 2 seconds.     Coloration: Skin is not pale.     Findings: No erythema or rash.     Nails: There is no clubbing or cyanosis.  Neurological:     Mental Status: He is alert and oriented to person, place, and time.     GCS: GCS eye subscore is 4. GCS verbal subscore is 5. GCS motor subscore is 6.     Cranial Nerves: No cranial nerve deficit.     Sensory: No sensory deficit.     Motor: No abnormal muscle tone.     Coordination: He displays a negative Romberg sign. Coordination normal.     Gait: Gait normal.     Deep Tendon Reflexes: Strength normal and reflexes are normal and symmetric. Reflexes normal.  Psychiatric:        Mood and Affect: Mood and affect normal.        Speech: Speech normal.        Behavior: Behavior normal.        Thought Content: Thought content normal.        Cognition and Memory: Cognition and memory normal.        Judgment: Judgment normal.     Depression Screen PHQ 2/9 Scores 02/22/2020  PHQ - 2  Score 3  PHQ- 9 Score 10   Results for orders placed or performed in visit on 02/22/20  CBC with Differential/Platelet  Result Value Ref Range   WBC 7.8 3.4 - 10.8 x10E3/uL   RBC 5.84 (H) 4.14 - 5.80 x10E6/uL   Hemoglobin 15.6 13.0 - 17.7 g/dL   Hematocrit 46.6 59.9 - 51.0 %   MCV 82 79 - 97 fL   MCH 26.7 26.6 - 33.0 pg   MCHC 32.6 31.5 - 35.7 g/dL   RDW 35.7 01.7 - 79.3 %   Platelets 360 150 - 450 x10E3/uL   Neutrophils 66 Not Estab. %   Lymphs 18 Not  Estab. %   Monocytes 13 Not Estab. %   Eos 2 Not Estab. %   Basos 1 Not Estab. %   Neutrophils Absolute 5.2 1.4 - 7.0 x10E3/uL   Lymphocytes Absolute 1.4 0.7 - 3.1 x10E3/uL   Monocytes Absolute 1.0 (H) 0.1 - 0.9 x10E3/uL   EOS (ABSOLUTE) 0.1 0.0 - 0.4 x10E3/uL   Basophils Absolute 0.1 0.0 - 0.2 x10E3/uL   Immature Granulocytes 0 Not Estab. %   Immature Grans (Abs) 0.0 0.0 - 0.1 x10E3/uL  Comprehensive metabolic panel  Result Value Ref Range   Glucose 66 65 - 99 mg/dL   BUN 13 6 - 20 mg/dL   Creatinine, Ser 9.03 0.76 - 1.27 mg/dL   GFR calc non Af Amer 94 >59 mL/min/1.73   GFR calc Af Amer 108 >59 mL/min/1.73   BUN/Creatinine Ratio 13 9 - 20   Sodium 140 134 - 144 mmol/L   Potassium 5.3 (H) 3.5 - 5.2 mmol/L   Chloride 100 96 - 106 mmol/L   CO2 25 20 - 29 mmol/L   Calcium 10.0 8.7 - 10.2 mg/dL   Total Protein 7.1 6.0 - 8.5 g/dL   Albumin 4.9 4.0 - 5.0 g/dL   Globulin, Total 2.2 1.5 - 4.5 g/dL   Albumin/Globulin Ratio 2.2 1.2 - 2.2   Bilirubin Total 0.5 0.0 - 1.2 mg/dL   Alkaline Phosphatase 130 (H) 44 - 121 IU/L   AST 17 0 - 40 IU/L   ALT 20 0 - 44 IU/L  Lipid panel  Result Value Ref Range   Cholesterol, Total 154 100 - 199 mg/dL   Triglycerides 62 0 - 149 mg/dL   HDL 58 >00 mg/dL   VLDL Cholesterol Cal 13 5 - 40 mg/dL   LDL Chol Calc (NIH) 83 0 - 99 mg/dL   Chol/HDL Ratio 2.7 0.0 - 5.0 ratio  TSH  Result Value Ref Range   TSH 0.605 0.450 - 4.500 uIU/mL  VITAMIN D 25 Hydroxy (Vit-D Deficiency, Fractures)  Result Value Ref Range   Vit D, 25-Hydroxy 32.8 30.0 - 100.0 ng/mL  HSV(herpes simplex vrs) 1+2 ab-IgM  Result Value Ref Range   HSVI/II Comb IgM <0.91 0.00 - 0.90 Ratio  HSV(herpes simplex vrs) 1+2 ab-IgG  Result Value Ref Range   HSV 1 Glycoprotein G Ab, IgG <0.91 0.00 - 0.90 index   HSV 2 IgG, Type Spec <0.91 0.00 - 0.90 index  HIV antibody (with reflex)  Result Value Ref Range   HIV Screen 4th Generation wRfx Non Reactive Non Reactive  Hepatitis C Antibody   Result Value Ref Range   Hep C Virus Ab <0.1 0.0 - 0.9 s/co ratio  RPR  Result Value Ref Range   RPR Ser Ql Non Reactive Non Reactive  POCT urinalysis dipstick  Result Value Ref Range   Color, UA  yellow clear    Clarity, UA negative    Glucose, UA Negative Negative   Bilirubin, UA negative    Ketones, UA negative    Spec Grav, UA 1.010 1.010 - 1.025   Blood, UA negative    pH, UA 7.5 5.0 - 8.0   Protein, UA Negative Negative   Urobilinogen, UA 0.2 0.2 or 1.0 E.U./dL   Nitrite, UA negative    Leukocytes, UA Negative Negative   Appearance     Odor      Assessment & Plan     Panic attacks - Plan: CBC with Differential/Platelet, Comprehensive metabolic panel, Lipid panel, TSH, POCT urinalysis dipstick, PARoxetine (PAXIL) 20 MG tablet, LORazepam (ATIVAN) 0.5 MG tablet  Anxiety - Plan: CBC with Differential/Platelet, Comprehensive metabolic panel, Lipid panel, TSH, POCT urinalysis dipstick, PARoxetine (PAXIL) 20 MG tablet, LORazepam (ATIVAN) 0.5 MG tablet  Vitamin D deficiency - Plan: VITAMIN D 25 Hydroxy (Vit-D Deficiency, Fractures)  Screen for STD (sexually transmitted disease) - Plan: HSV(herpes simplex vrs) 1+2 ab-IgM, HSV(herpes simplex vrs) 1+2 ab-IgG, HIV antibody (with reflex), Hepatitis C Antibody, RPR  Orders Placed This Encounter  Procedures  . CBC with Differential/Platelet  . Comprehensive metabolic panel  . Lipid panel  . TSH  . VITAMIN D 25 Hydroxy (Vit-D Deficiency, Fractures)  . HSV(herpes simplex vrs) 1+2 ab-IgM  . HSV(herpes simplex vrs) 1+2 ab-IgG  . HIV antibody (with reflex)  . Hepatitis C Antibody  . RPR  . POCT urinalysis dipstick   Meds ordered this encounter  Medications  . PARoxetine (PAXIL) 20 MG tablet    Sig: Take 1 tablet (20 mg total) by mouth daily.    Dispense:  90 tablet    Refill:  0  . LORazepam (ATIVAN) 0.5 MG tablet    Sig: Take 1 tablet (0.5 mg total) by mouth every 8 (eight) hours.    Dispense:  30 tablet    Refill:  0    Discussed known black box warning for anti depression/ anxiety medication. Need to report any behavioral changes right, if any homicidal or suicidal thoughts or ideas seek medical attention right away. Call 911.    Return in about 1 month (around 03/24/2020), or if symptoms worsen or fail to improve, for at any time for any worsening symptoms, Go to Emergency room/ urgent care if worse.     The entirety of the information documented in the History of Present Illness, Review of Systems and Physical Exam were personally obtained by me. Portions of this information were initially documented by the CMA and reviewed by me for thoroughness and accuracy.   Red Flags discussed. The patient was given clear instructions to go to ER or return to medical center if any red flags develop, symptoms do not improve, worsen or new problems develop. They verbalized understanding.   Jairo Ben, FNP  [facility] [phone] (phone) [phone] (fax)  [facility]

## 2020-02-23 LAB — CBC WITH DIFFERENTIAL/PLATELET
Hemoglobin: 15.6 g/dL (ref 13.0–17.7)
Monocytes: 13 %
RDW: 12.1 % (ref 11.6–15.4)

## 2020-02-25 ENCOUNTER — Other Ambulatory Visit: Payer: Self-pay | Admitting: Adult Health

## 2020-02-25 DIAGNOSIS — E559 Vitamin D deficiency, unspecified: Secondary | ICD-10-CM

## 2020-02-25 MED ORDER — VITAMIN D (ERGOCALCIFEROL) 1.25 MG (50000 UNIT) PO CAPS: 50000.0000 [IU] | ORAL_CAPSULE | ORAL | 0 refills | Status: DC

## 2020-02-25 NOTE — Progress Notes (Signed)
Meds ordered this encounter  Medications  . Vitamin D, Ergocalciferol, (DRISDOL) 1.25 MG (50000 UNIT) CAPS capsule    Sig: Take 1 capsule (50,000 Units total) by mouth every 7 (seven) days. (taking one tablet per week) walk in lab in office 1-2 weeks after completing prescription.    Dispense:  12 capsule    Refill:  0   Orders Placed This Encounter  Procedures  . VITAMIN D 25 Hydroxy (Vit-D Deficiency, Fractures)   

## 2020-02-25 NOTE — Progress Notes (Signed)
CBC ok, CMP - alkaline phosphatase mild elevation , will recheck at next visit in one month.  Cholesterol within normal limits.  TSH for thyroid is on low need but within normal, will recheck in 6 months TSH.  Vitamin D is low end normal, Vitamin  D is low, this can contribute to poor sleep and fatigue, will send in prescription for Vitamin D at 50,000 units by mouth once every 7 days/(once weekly) for 12 weeks. Advise recheck lab Vitamin D in 1-2 weeks after completing vitamin d prescription. Lab iis walk in and is closed during lunch during regular office hours.provider sent in medication. 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

## 2020-02-26 LAB — CBC WITH DIFFERENTIAL/PLATELET
Basophils Absolute: 0.1 10*3/uL (ref 0.0–0.2)
Basos: 1 %
EOS (ABSOLUTE): 0.1 10*3/uL (ref 0.0–0.4)
Eos: 2 %
Hematocrit: 47.8 % (ref 37.5–51.0)
Immature Grans (Abs): 0 10*3/uL (ref 0.0–0.1)
Immature Granulocytes: 0 %
Lymphocytes Absolute: 1.4 10*3/uL (ref 0.7–3.1)
Lymphs: 18 %
MCH: 26.7 pg (ref 26.6–33.0)
MCHC: 32.6 g/dL (ref 31.5–35.7)
MCV: 82 fL (ref 79–97)
Monocytes Absolute: 1 10*3/uL — ABNORMAL HIGH (ref 0.1–0.9)
Neutrophils Absolute: 5.2 10*3/uL (ref 1.4–7.0)
Neutrophils: 66 %
Platelets: 360 10*3/uL (ref 150–450)
RBC: 5.84 x10E6/uL — ABNORMAL HIGH (ref 4.14–5.80)
WBC: 7.8 10*3/uL (ref 3.4–10.8)

## 2020-02-26 LAB — COMPREHENSIVE METABOLIC PANEL
ALT: 20 IU/L (ref 0–44)
AST: 17 IU/L (ref 0–40)
Albumin/Globulin Ratio: 2.2 (ref 1.2–2.2)
Albumin: 4.9 g/dL (ref 4.0–5.0)
Alkaline Phosphatase: 130 IU/L — ABNORMAL HIGH (ref 44–121)
BUN/Creatinine Ratio: 13 (ref 9–20)
BUN: 13 mg/dL (ref 6–20)
Bilirubin Total: 0.5 mg/dL (ref 0.0–1.2)
CO2: 25 mmol/L (ref 20–29)
Calcium: 10 mg/dL (ref 8.7–10.2)
Chloride: 100 mmol/L (ref 96–106)
Creatinine, Ser: 1.03 mg/dL (ref 0.76–1.27)
GFR calc Af Amer: 108 mL/min/{1.73_m2} (ref >59)
GFR calc non Af Amer: 94 mL/min/{1.73_m2} (ref >59)
Globulin, Total: 2.2 g/dL (ref 1.5–4.5)
Glucose: 66 mg/dL (ref 65–99)
Potassium: 5.3 mmol/L — ABNORMAL HIGH (ref 3.5–5.2)
Sodium: 140 mmol/L (ref 134–144)
Total Protein: 7.1 g/dL (ref 6.0–8.5)

## 2020-02-26 LAB — HIV ANTIBODY (ROUTINE TESTING W REFLEX): HIV Screen 4th Generation wRfx: NONREACTIVE

## 2020-02-26 LAB — HEPATITIS C ANTIBODY: Hep C Virus Ab: <0.1 s/co ratio (ref 0.0–0.9)

## 2020-02-26 LAB — RPR: RPR Ser Ql: NONREACTIVE

## 2020-02-26 LAB — HSV(HERPES SIMPLEX VRS) I + II AB-IGM: HSVI/II Comb IgM: <0.91 Ratio (ref 0.00–0.90)

## 2020-02-26 LAB — TSH: TSH: 0.605 u[IU]/mL (ref 0.450–4.500)

## 2020-02-26 LAB — HSV(HERPES SIMPLEX VRS) I + II AB-IGG
HSV 1 Glycoprotein G Ab, IgG: <0.91 index (ref 0.00–0.90)
HSV 2 IgG, Type Spec: <0.91 index (ref 0.00–0.90)

## 2020-02-26 LAB — LIPID PANEL
Chol/HDL Ratio: 2.7 ratio (ref 0.0–5.0)
Cholesterol, Total: 154 mg/dL (ref 100–199)
HDL: 58 mg/dL (ref >39)
LDL Chol Calc (NIH): 83 mg/dL (ref 0–99)
Triglycerides: 62 mg/dL (ref 0–149)
VLDL Cholesterol Cal: 13 mg/dL (ref 5–40)

## 2020-02-26 LAB — VITAMIN D 25 HYDROXY (VIT D DEFICIENCY, FRACTURES): Vit D, 25-Hydroxy: 32.8 ng/mL (ref 30.0–100.0)

## 2020-02-28 ENCOUNTER — Encounter: Payer: Self-pay | Admitting: Adult Health

## 2020-03-02 DIAGNOSIS — Z113 Encounter for screening for infections with a predominantly sexual mode of transmission: Secondary | ICD-10-CM | POA: Insufficient documentation

## 2020-03-02 DIAGNOSIS — E559 Vitamin D deficiency, unspecified: Secondary | ICD-10-CM | POA: Insufficient documentation

## 2020-03-04 ENCOUNTER — Encounter: Payer: Self-pay | Admitting: Family Medicine

## 2020-03-04 ENCOUNTER — Other Ambulatory Visit: Payer: BC Managed Care – PPO

## 2020-03-04 ENCOUNTER — Telehealth (INDEPENDENT_AMBULATORY_CARE_PROVIDER_SITE_OTHER): Payer: BC Managed Care – PPO | Admitting: Family Medicine

## 2020-03-04 DIAGNOSIS — Z20822 Contact with and (suspected) exposure to covid-19: Secondary | ICD-10-CM | POA: Diagnosis not present

## 2020-03-04 NOTE — Patient Instructions (Signed)

## 2020-03-04 NOTE — Progress Notes (Signed)
MyChart Video Visit    Virtual Visit via Video Note   This visit type was conducted due to national recommendations for restrictions regarding the COVID-19 Pandemic (e.g. social distancing) in an effort to limit this patient's exposure and mitigate transmission in our community. This patient is at least at moderate risk for complications without adequate follow up. This format is felt to be most appropriate for this patient at this time. Physical exam was limited by quality of the video and audio technology used for the visit.    Patient location: home Provider location: [facility] Persons involved in the visit: patient, provider  I discussed the limitations of evaluation and management by telemedicine and the availability of in person appointments. The patient expressed understanding and agreed to proceed.  Patient: Todd Fleming   DOB: 1986/01/19   35 y.o. Male  MRN: [id number] Visit Date: 03/04/2020  Today's healthcare provider: Shirlee Latch, MD   Chief Complaint  Patient presents with  . Fever  . Cough   Subjective    Fever  This is a new problem. The current episode started yesterday. The problem has been gradually improving. The maximum temperature noted was 103 to 103.9 F. Associated symptoms include congestion, coughing, headaches, muscle aches and a sore throat. Pertinent negatives include no abdominal pain, ear pain, nausea or wheezing.    First day of symptoms 1/16 (just a headache) Bad headaches Fever better today, but still 100.6 +sore throat and productive cough Taking tylenol with only mild relief  No known sick contacts, but has been around a lot of people Ex-wife has COVID now and was around his daughter this week  Not vaccinated against COVID  Patient Active Problem List   Diagnosis Date Noted  . Vitamin D deficiency 03/02/2020  . Screen for STD (sexually transmitted disease) 03/02/2020  . Panic attacks 02/22/2020  . Anxiety  02/22/2020   History reviewed. No pertinent past medical history. Social History   Tobacco Use  . Smoking status: Current Every Day Smoker    Types: E-cigarettes  . Smokeless tobacco: Current User  Substance Use Topics  . Alcohol use: Yes   No Known Allergies  Medications: Outpatient Medications Prior to Visit  Medication Sig  . LORazepam (ATIVAN) 0.5 MG tablet Take 1 tablet (0.5 mg total) by mouth every 8 (eight) hours.  Marland Kitchen PARoxetine (PAXIL) 20 MG tablet Take 1 tablet (20 mg total) by mouth daily.  Marland Kitchen HYDROcodone-acetaminophen (NORCO/VICODIN) 5-325 MG tablet Take 1 tablet by mouth every 4 (four) hours as needed. (Patient not taking: No sig reported)  . Vitamin D, Ergocalciferol, (DRISDOL) 1.25 MG (50000 UNIT) CAPS capsule Take 1 capsule (50,000 Units total) by mouth every 7 (seven) days. (taking one tablet per week) walk in lab in office 1-2 weeks after completing prescription. (Patient not taking: Reported on 03/04/2020)   No facility-administered medications prior to visit.    Review of Systems  Constitutional: Positive for fatigue and fever.  HENT: Positive for congestion, postnasal drip, sinus pressure, sinus pain, sore throat and tinnitus. Negative for ear discharge, ear pain and hearing loss.   Respiratory: Positive for cough. Negative for apnea, choking, chest tightness, shortness of breath, wheezing and stridor.   Gastrointestinal: Negative.  Negative for abdominal pain and nausea.  Musculoskeletal: Positive for myalgias.  Neurological: Positive for light-headedness and headaches. Negative for dizziness.      Objective    There were no vitals taken for this visit.   Physical Exam Constitutional:  General: He is not in acute distress.    Appearance: Normal appearance. He is not diaphoretic.  HENT:     Head: Normocephalic and atraumatic.  Pulmonary:     Effort: Pulmonary effort is normal. No respiratory distress.  Neurological:     Mental Status: He is alert  and oriented to person, place, and time.        Assessment & Plan     1. Suspected COVID-19 virus infection - symptoms c/w viral URI  - no evidence of strep pharyngitis, CAP, AOM, bacterial sinusitis, or other bacterial infection - concern for possible COVID19 infection - will send for outpatient testing - discussed need to quarantine 10 days (though some jobs require to come back at 5 days due to new CDC guidelines) from start of symptoms and until fever-free for at least 24 hours - discussed symptomatic management, natural course, and return precautions  - Novel Coronavirus, NAA ([facility])   Return if symptoms worsen or fail to improve.     I discussed the assessment and treatment plan with the patient. The patient was provided an opportunity to ask questions and all were answered. The patient agreed with the plan and demonstrated an understanding of the instructions.   The patient was advised to call back or seek an in-person evaluation if the symptoms worsen or if the condition fails to improve as anticipated.   I, Shirlee Latch, MD, have reviewed all documentation for this visit. The documentation on 03/04/20 for the exam, diagnosis, procedures, and orders are all accurate and complete.   Lyndee Herbst, Marzella Schlein, MD, MPH [facility]

## 2020-03-06 LAB — SARS-COV-2, NAA 2 DAY TAT

## 2020-03-06 LAB — NOVEL CORONAVIRUS, NAA: SARS-CoV-2, NAA: DETECTED — AB

## 2020-03-28 ENCOUNTER — Ambulatory Visit (INDEPENDENT_AMBULATORY_CARE_PROVIDER_SITE_OTHER): Payer: BC Managed Care – PPO | Admitting: Adult Health

## 2020-03-28 ENCOUNTER — Other Ambulatory Visit: Payer: Self-pay

## 2020-03-28 ENCOUNTER — Encounter: Payer: Self-pay | Admitting: Adult Health

## 2020-03-28 DIAGNOSIS — F419 Anxiety disorder, unspecified: Secondary | ICD-10-CM | POA: Diagnosis not present

## 2020-03-28 DIAGNOSIS — F410 Panic disorder [episodic paroxysmal anxiety] without agoraphobia: Secondary | ICD-10-CM | POA: Diagnosis not present

## 2020-03-28 MED ORDER — ESCITALOPRAM OXALATE 10 MG PO TABS: 10.0000 mg | ORAL_TABLET | Freq: Every day | ORAL | 0 refills | Status: DC

## 2020-03-28 MED ORDER — LORAZEPAM 0.5 MG PO TABS: 0.5000 mg | ORAL_TABLET | Freq: Three times a day (TID) | ORAL | 0 refills | Status: DC

## 2020-03-28 NOTE — Progress Notes (Signed)
Established patient visit   Patient: Todd Fleming   DOB: Oct 01, 1985   35 y.o. Male  MRN: [id number] Visit Date: 03/28/2020  Today's healthcare provider: Jairo Ben, FNP   Chief Complaint  Patient presents with  . Anxiety   Subjective    HPI  Anxiety, Follow-up  He was last seen for anxiety 1 months ago. Changes made at last visit include started patient on Paxil 20mg  and started patient on Ativan 0.5mg  .   He reports excellent compliance with treatment. Patient reports that he ran out of Ativan and had been taking. He is not having side effects.   He feels his anxiety is moderate and Unchanged since last visit.  Symptoms: No chest pain Yes difficulty concentrating  No dizziness No fatigue  No feelings of losing control No insomnia  Yes irritable No palpitations  Yes panic attacks No racing thoughts  No shortness of breath No sweating  No tremors/shakes    Denies any suicidal or homicidal ideations.  Patient  denies any fever, body aches,chills, rash, chest pain, shortness of breath, nausea, vomiting, or diarrhea.  Denies dizziness, lightheadedness, pre syncopal or syncopal episodes.   GAD-7 Results No flowsheet data found.  PHQ-9 Scores PHQ9 SCORE ONLY 02/22/2020  PHQ-9 Total Score 10    ---------------------------------------------------------------------------------------------------    Medications: Outpatient Medications Prior to Visit  Medication Sig  . [DISCONTINUED] LORazepam (ATIVAN) 0.5 MG tablet Take 1 tablet (0.5 mg total) by mouth every 8 (eight) hours.  . [DISCONTINUED] PARoxetine (PAXIL) 20 MG tablet Take 1 tablet (20 mg total) by mouth daily.  . Vitamin D, Ergocalciferol, (DRISDOL) 1.25 MG (50000 UNIT) CAPS capsule Take 1 capsule (50,000 Units total) by mouth every 7 (seven) days. (taking one tablet per week) walk in lab in office 1-2 weeks after completing prescription. (Patient not taking: Reported on 03/28/2020)  .  [DISCONTINUED] HYDROcodone-acetaminophen (NORCO/VICODIN) 5-325 MG tablet Take 1 tablet by mouth every 4 (four) hours as needed. (Patient not taking: No sig reported)   No facility-administered medications prior to visit.    Review of Systems  Constitutional: Negative.  Negative for fatigue.  HENT: Negative.   Respiratory: Negative.   Cardiovascular: Negative.  Negative for chest pain.  Gastrointestinal: Negative.   Musculoskeletal: Negative.   Neurological: Negative.  Negative for tremors.  Hematological: Negative.   Psychiatric/Behavioral: The patient is nervous/anxious.     Last CBC Lab Results  Component Value Date   WBC 7.8 02/22/2020   HGB 15.6 02/22/2020   HCT 47.8 02/22/2020   MCV 82 02/22/2020   MCH 26.7 02/22/2020   RDW 12.1 02/22/2020   PLT 360 02/22/2020   Last metabolic panel Lab Results  Component Value Date   GLUCOSE 66 02/22/2020   NA 140 02/22/2020   K 5.3 (H) 02/22/2020   CL 100 02/22/2020   CO2 25 02/22/2020   BUN 13 02/22/2020   CREATININE 1.03 02/22/2020   GFRNONAA 94 02/22/2020   GFRAA 108 02/22/2020   CALCIUM 10.0 02/22/2020   PROT 7.1 02/22/2020   ALBUMIN 4.9 02/22/2020   LABGLOB 2.2 02/22/2020   AGRATIO 2.2 02/22/2020   BILITOT 0.5 02/22/2020   ALKPHOS 130 (H) 02/22/2020   AST 17 02/22/2020   ALT 20 02/22/2020   ANIONGAP 10 09/04/2017   Last lipids Lab Results  Component Value Date   CHOL 154 02/22/2020   HDL 58 02/22/2020   LDLCALC 83 02/22/2020   TRIG 62 02/22/2020   CHOLHDL 2.7 02/22/2020  Last hemoglobin A1c No results found for: HGBA1C Last thyroid functions Lab Results  Component Value Date   TSH 0.605 02/22/2020   Last vitamin D Lab Results  Component Value Date   VD25OH 32.8 02/22/2020   Last vitamin B12 and Folate No results found for: VITAMINB12, FOLATE     Objective    BP 117/77   Pulse 75   Resp 16   Wt 150 lb 6.4 oz (68.2 kg)   SpO2 98%   BMI 22.54 kg/m  BP Readings from Last 3 Encounters:   03/28/20 117/77  02/22/20 (!) 152/97  02/07/20 138/78   Wt Readings from Last 3 Encounters:  03/28/20 150 lb 6.4 oz (68.2 kg)  02/22/20 149 lb 6.4 oz (67.8 kg)  02/07/20 150 lb (68 kg)       Physical Exam Vitals and nursing note reviewed.  Constitutional:      General: He is active. He is not in acute distress.    Appearance: Normal appearance. He is well-developed and well-nourished. He is not ill-appearing, toxic-appearing, sickly-appearing or diaphoretic.     Comments: Patient is alert and oriented and responsive to questions Engages in eye contact with provider. Speaks in full sentences without any pauses without any shortness of breath or distress.    HENT:     Head: Normocephalic and atraumatic.     Right Ear: Hearing, tympanic membrane, ear canal and external ear normal. There is no impacted cerumen.     Left Ear: Hearing, tympanic membrane, ear canal and external ear normal. There is no impacted cerumen.     Nose: Nose normal.     Mouth/Throat:     Mouth: Oropharynx is clear and moist and mucous membranes are normal. Mucous membranes are moist.     Pharynx: Uvula midline. No oropharyngeal exudate.  Eyes:     General: Lids are normal. No scleral icterus.       Right eye: No discharge.        Left eye: No discharge.     Extraocular Movements: Extraocular movements intact and EOM normal.     Conjunctiva/sclera: Conjunctivae normal.     Pupils: Pupils are equal, round, and reactive to light.  Neck:     Thyroid: No thyromegaly.     Vascular: Normal carotid pulses. No carotid bruit, hepatojugular reflux or JVD.     Trachea: Trachea and phonation normal. No tracheal tenderness or tracheal deviation.     Meningeal: Brudzinski's sign absent.  Cardiovascular:     Rate and Rhythm: Normal rate and regular rhythm.     Pulses: Normal pulses and intact distal pulses.     Heart sounds: Normal heart sounds, S1 normal and S2 normal. Heart sounds not distant. No murmur heard. No  friction rub. No gallop.   Pulmonary:     Effort: Pulmonary effort is normal. No accessory muscle usage or respiratory distress.     Breath sounds: Normal breath sounds. No stridor. No wheezing or rales.  Chest:     Chest wall: No tenderness.  Abdominal:     General: Bowel sounds are normal. There is no distension. Aorta is normal.     Palpations: Abdomen is soft. There is no mass.     Tenderness: There is no abdominal tenderness. There is no right CVA tenderness, left CVA tenderness, guarding or rebound.     Hernia: No hernia is present.  Genitourinary:    Comments: No gynecology exams done in this office currently and no equipment available. Patient  is aware she will have to see gynecology if needed for any pelvic/vaginal exam and will follow up with gynecology/obgyn as needed. Yearly gynecology pelvic exam recommended. Patient verbalized understanding of instructions and denies any further questions at this time.   Musculoskeletal:        General: No tenderness, deformity or edema. Normal range of motion.     Cervical back: Full passive range of motion without pain, normal range of motion and neck supple. No rigidity or tenderness.     Comments: Patient moves on and off of exam table and in room without difficulty. Gait is normal in hall and in room. Patient is oriented to person place time and situation. Patient answers questions appropriately and engages in conversation.   Lymphadenopathy:     Head:     Right side of head: No submental, submandibular, tonsillar, preauricular, posterior auricular or occipital adenopathy.     Left side of head: No submental, submandibular, tonsillar, preauricular, posterior auricular or occipital adenopathy.     Cervical: No cervical adenopathy.     Upper Body:  No axillary adenopathy present. Skin:    General: Skin is warm, dry and intact.     Capillary Refill: Capillary refill takes less than 2 seconds.     Coloration: Skin is not pale.      Findings: No erythema or rash.     Nails: There is no clubbing or cyanosis.  Neurological:     General: No focal deficit present.     Mental Status: He is alert and oriented to person, place, and time.     GCS: GCS eye subscore is 4. GCS verbal subscore is 5. GCS motor subscore is 6.     Cranial Nerves: No cranial nerve deficit.     Sensory: No sensory deficit.     Motor: No abnormal muscle tone.     Coordination: He displays a negative Romberg sign. Coordination normal.     Deep Tendon Reflexes: Strength normal and reflexes are normal and symmetric. Reflexes normal.  Psychiatric:        Mood and Affect: Mood and affect and mood normal.        Speech: Speech normal.        Behavior: Behavior normal.        Thought Content: Thought content normal.        Cognition and Memory: Cognition and memory normal.        Judgment: Judgment normal.       No results found for any visits on 03/28/20.  Assessment & Plan     Panic attacks - Plan: LORazepam (ATIVAN) 0.5 MG tablet  Anxiety - Plan: LORazepam (ATIVAN) 0.5 MG tablet   Meds ordered this encounter  Medications  . escitalopram (LEXAPRO) 10 MG tablet    Sig: Take 1 tablet (10 mg total) by mouth daily.    Dispense:  60 tablet    Refill:  0  . LORazepam (ATIVAN) 0.5 MG tablet    Sig: Take 1 tablet (0.5 mg total) by mouth every 8 (eight) hours. Will cause drowsiness.    Dispense:  60 tablet    Refill:  0    Paxil discontinued.    Discussed known black box warning for anti depression/ anxiety medication. Need to report any behavioral changes right, if any homicidal or suicidal thoughts or ideas seek medical attention right away. Call 911.  Recommended psychiatry evaluation he declined at this time. Advise counseling.   . Return in about 1  month (around 04/25/2020), or if symptoms worsen or fail to improve, for at any time for any worsening symptoms, Go to Emergency room/ urgent care if worse.      Red Flags discussed. The  patient was given clear instructions to go to ER or return to medical center if any red flags develop, symptoms do not improve, worsen or new problems develop. They verbalized understanding.    Jairo Ben, FNP  [facility] [phone] (phone) [phone] (fax)  [facility]

## 2020-03-28 NOTE — Patient Instructions (Signed)
Lorazepam tablets What is this medicine? LORAZEPAM (lor A ze pam) is a benzodiazepine. It is used to treat anxiety. This medicine may be used for other purposes; ask your health care provider or pharmacist if you have questions. COMMON BRAND NAME(S): Ativan What should I tell my health care provider before I take this medicine? They need to know if you have any of these conditions:  glaucoma  history of drug or alcohol abuse problem  kidney disease  liver disease  lung or breathing disease, like asthma  mental illness  myasthenia gravis  Parkinson's disease  suicidal thoughts, plans, or attempt; a previous suicide attempt by you or a family member  an unusual or allergic reaction to lorazepam, other medicines, foods, dyes, or preservatives  pregnant or trying to get pregnant  breast-feeding How should I use this medicine? Take this medicine by mouth with a glass of water. Follow the directions on the prescription label. Take your medicine at regular intervals. Do not take it more often than directed. Do not stop taking except on your doctor's advice. A special MedGuide will be given to you by the pharmacist with each prescription and refill. Be sure to read this information carefully each time. Talk to your pediatrician regarding the use of this medicine in children. While this drug may be used in children as young as 12 years for selected conditions, precautions do apply. Overdosage: If you think you have taken too much of this medicine contact a poison control center or emergency room at once. NOTE: This medicine is only for you. Do not share this medicine with others. What if I miss a dose? If you miss a dose, take it as soon as you can. If it is almost time for your next dose, take only that dose. Do not take double or extra doses. What may interact with this medicine? Do not take this medicine with any of the following medications:  narcotic medicines for  cough  sodium oxybate This medicine may also interact with the following medications:  alcohol  antihistamines for allergy, cough and cold  certain medicines for anxiety or sleep  certain medicines for depression, like amitriptyline, fluoxetine, sertraline  certain medicines for seizures like carbamazepine, phenobarbital, phenytoin, primidone  general anesthetics like lidocaine, pramoxine, tetracaine  MAOIs like Carbex, Eldepryl, Marplan, Nardil, and Parnate  medicines that relax muscles for surgery  narcotic medicines for pain  phenothiazines like chlorpromazine, mesoridazine, prochlorperazine, thioridazine This list may not describe all possible interactions. Give your health care provider a list of all the medicines, herbs, non-prescription drugs, or dietary supplements you use. Also tell them if you smoke, drink alcohol, or use illegal drugs. Some items may interact with your medicine. What should I watch for while using this medicine? Tell your doctor or health care professional if your symptoms do not start to get better or if they get worse. Do not stop taking except on your doctor's advice. You may develop a severe reaction. Your doctor will tell you how much medicine to take. You may get drowsy or dizzy. Do not drive, use machinery, or do anything that needs mental alertness until you know how this medicine affects you. To reduce the risk of dizzy and fainting spells, do not stand or sit up quickly, especially if you are an older patient. Alcohol may increase dizziness and drowsiness. Avoid alcoholic drinks. If you are taking another medicine that also causes drowsiness, you may have more side effects. Give your health care provider a  list of all medicines you use. Your doctor will tell you how much medicine to take. Do not take more medicine than directed. Call emergency for help if you have problems breathing or unusual sleepiness. What side effects may I notice from  receiving this medicine? Side effects that you should report to your doctor or health care professional as soon as possible:  allergic reactions like skin rash, itching or hives, swelling of the face, lips, or tongue  breathing problems  confusion  loss of balance or coordination  signs and symptoms of low blood pressure like dizziness; feeling faint or lightheaded, falls; unusually weak or tired  suicidal thoughts or other mood changes Side effects that usually do not require medical attention (report to your doctor or health care professional if they continue or are bothersome):  dizziness  headache  nausea, vomiting  tiredness This list may not describe all possible side effects. Call your doctor for medical advice about side effects. You may report side effects to FDA at 1-800-FDA-1088. Where should I keep my medicine? Keep out of the reach of children. This medicine can be abused. Keep your medicine in a safe place to protect it from theft. Do not share this medicine with anyone. Selling or giving away this medicine is dangerous and against the law. This medicine may cause accidental overdose and death if taken by other adults, children, or pets. Mix any unused medicine with a substance like cat litter or coffee grounds. Then throw the medicine away in a sealed container like a sealed bag or a coffee can with a lid. Do not use the medicine after the expiration date. Store at room temperature between 20 and 25 degrees C (68 and 77 degrees F). Protect from light. Keep container tightly closed. NOTE: This sheet is a summary. It may not cover all possible information. If you have questions about this medicine, talk to your doctor, pharmacist, or health care provider.  2021 Elsevier/Gold Standard (2014-10-31 15:54:27) Escitalopram Tablets What is this medicine? ESCITALOPRAM (es sye TAL oh pram) is used to treat depression and certain types of anxiety. This medicine may be used for  other purposes; ask your health care provider or pharmacist if you have questions. COMMON BRAND NAME(S): Lexapro What should I tell my health care provider before I take this medicine? They need to know if you have any of these conditions:  bipolar disorder or a family history of bipolar disorder  diabetes  glaucoma  heart disease  kidney or liver disease  receiving electroconvulsive therapy  seizures (convulsions)  suicidal thoughts, plans, or attempt by you or a family member  an unusual or allergic reaction to escitalopram, the related drug citalopram, other medicines, foods, dyes, or preservatives  pregnant or trying to become pregnant  breast-feeding How should I use this medicine? Take this medicine by mouth with a glass of water. Follow the directions on the prescription label. You can take it with or without food. If it upsets your stomach, take it with food. Take your medicine at regular intervals. Do not take it more often than directed. Do not stop taking this medicine suddenly except upon the advice of your doctor. Stopping this medicine too quickly may cause serious side effects or your condition may worsen. A special MedGuide will be given to you by the pharmacist with each prescription and refill. Be sure to read this information carefully each time. Talk to your pediatrician regarding the use of this medicine in children. Special care may  be needed. Overdosage: If you think you have taken too much of this medicine contact a poison control center or emergency room at once. NOTE: This medicine is only for you. Do not share this medicine with others. What if I miss a dose? If you miss a dose, take it as soon as you can. If it is almost time for your next dose, take only that dose. Do not take double or extra doses. What may interact with this medicine? Do not take this medicine with any of the following medications:  certain medicines for fungal infections like  fluconazole, itraconazole, ketoconazole, posaconazole, voriconazole  cisapride  citalopram  dronedarone  linezolid  MAOIs like Carbex, Eldepryl, Marplan, Nardil, and Parnate  methylene blue (injected into a vein)  pimozide  thioridazine This medicine may also interact with the following medications:  alcohol  amphetamines  aspirin and aspirin-like medicines  carbamazepine  certain medicines for depression, anxiety, or psychotic disturbances  certain medicines for migraine headache like almotriptan, eletriptan, frovatriptan, naratriptan, rizatriptan, sumatriptan, zolmitriptan  certain medicines for sleep  certain medicines that treat or prevent blood clots like warfarin, enoxaparin, dalteparin  cimetidine  diuretics  dofetilide  fentanyl  furazolidone  isoniazid  lithium  metoprolol  NSAIDs, medicines for pain and inflammation, like ibuprofen or naproxen  other medicines that prolong the QT interval (cause an abnormal heart rhythm)  procarbazine  rasagiline  supplements like St. John's wort, kava kava, valerian  tramadol  tryptophan  ziprasidone This list may not describe all possible interactions. Give your health care provider a list of all the medicines, herbs, non-prescription drugs, or dietary supplements you use. Also tell them if you smoke, drink alcohol, or use illegal drugs. Some items may interact with your medicine. What should I watch for while using this medicine? Tell your doctor if your symptoms do not get better or if they get worse. Visit your doctor or health care professional for regular checks on your progress. Because it may take several weeks to see the full effects of this medicine, it is important to continue your treatment as prescribed by your doctor. Patients and their families should watch out for new or worsening thoughts of suicide or depression. Also watch out for sudden changes in feelings such as feeling anxious,  agitated, panicky, irritable, hostile, aggressive, impulsive, severely restless, overly excited and hyperactive, or not being able to sleep. If this happens, especially at the beginning of treatment or after a change in dose, call your health care professional. Bonita Quin may get drowsy or dizzy. Do not drive, use machinery, or do anything that needs mental alertness until you know how this medicine affects you. Do not stand or sit up quickly, especially if you are an older patient. This reduces the risk of dizzy or fainting spells. Alcohol may interfere with the effect of this medicine. Avoid alcoholic drinks. Your mouth may get dry. Chewing sugarless gum or sucking hard candy, and drinking plenty of water may help. Contact your doctor if the problem does not go away or is severe. What side effects may I notice from receiving this medicine? Side effects that you should report to your doctor or health care professional as soon as possible:  allergic reactions like skin rash, itching or hives, swelling of the face, lips, or tongue  anxious  black, tarry stools  changes in vision  confusion  elevated mood, decreased need for sleep, racing thoughts, impulsive behavior  eye pain  fast, irregular heartbeat  feeling faint or  lightheaded, falls  feeling agitated, angry, or irritable  hallucination, loss of contact with reality  loss of balance or coordination  loss of memory  painful or prolonged erections  restlessness, pacing, inability to keep still  seizures  stiff muscles  suicidal thoughts or other mood changes  trouble sleeping  unusual bleeding or bruising  unusually weak or tired  vomiting Side effects that usually do not require medical attention (report to your doctor or health care professional if they continue or are bothersome):  changes in appetite  change in sex drive or performance  headache  increased sweating  indigestion, nausea  tremors This list  may not describe all possible side effects. Call your doctor for medical advice about side effects. You may report side effects to FDA at 1-800-FDA-1088. Where should I keep my medicine? Keep out of reach of children. Store at room temperature between 15 and 30 degrees C (59 and 86 degrees F). Throw away any unused medicine after the expiration date. NOTE: This sheet is a summary. It may not cover all possible information. If you have questions about this medicine, talk to your doctor, pharmacist, or health care provider.  2021 Elsevier/Gold Standard (2019-12-24 09:53:34)

## 2020-04-25 ENCOUNTER — Other Ambulatory Visit: Payer: Self-pay | Admitting: Adult Health

## 2020-04-25 ENCOUNTER — Ambulatory Visit: Payer: BC Managed Care – PPO | Admitting: Adult Health

## 2020-04-25 ENCOUNTER — Other Ambulatory Visit: Payer: Self-pay

## 2020-04-25 ENCOUNTER — Encounter: Payer: Self-pay | Admitting: Adult Health

## 2020-04-25 VITALS — BP 111/72 | HR 68 | Temp 98.1°F | Resp 16 | Wt 155.6 lb

## 2020-04-25 DIAGNOSIS — F410 Panic disorder [episodic paroxysmal anxiety] without agoraphobia: Secondary | ICD-10-CM

## 2020-04-25 DIAGNOSIS — F419 Anxiety disorder, unspecified: Secondary | ICD-10-CM

## 2020-04-25 DIAGNOSIS — K219 Gastro-esophageal reflux disease without esophagitis: Secondary | ICD-10-CM | POA: Diagnosis not present

## 2020-04-25 MED ORDER — OMEPRAZOLE 40 MG PO CPDR: 40.0000 mg | DELAYED_RELEASE_CAPSULE | Freq: Every day | ORAL | 1 refills | Status: DC

## 2020-04-25 MED ORDER — ESCITALOPRAM OXALATE 5 MG PO TABS: 15.0000 mg | ORAL_TABLET | Freq: Every day | ORAL | 0 refills | Status: DC

## 2020-04-25 NOTE — Progress Notes (Signed)
Established patient visit   Patient: Todd Fleming   DOB: 17-Sep-1985   35 y.o. Male  MRN: [id number] Visit Date: 04/25/2020  Today's healthcare provider: Jairo Ben, FNP   Chief Complaint  Patient presents with  . Anxiety   Subjective    HPI  Anxiety, Follow-up  He was last seen for anxiety 1 months ago. Changes made at last visit include discontinued Paxil and started patient on Lexapro 5mg  and Ativan 0.5mg .   He reports excellent compliance with treatment. He reports excellent tolerance of treatment. He is having side effects. Patient states that he has experienced headaches , patient states that he has also noticed that his acid levels seem to be up and states that otc Zantac is not helping.  Headaches have been unrelated to medicine he reports he feels less stressed and headaches are mild.   Patient  denies any fever, body aches,chills, rash, chest pain, shortness of breath, nausea, vomiting, or diarrhea.  Denies dizziness, lightheadedness, pre syncopal or syncopal episodes.    He feels his anxiety is moderate and gradually improving since last visit.  Symptoms: No chest pain No difficulty concentrating  No dizziness No fatigue  No feelings of losing control No insomnia  No irritable No palpitations  Yes panic attacks No racing thoughts  No shortness of breath No sweating  No tremors/shakes    GAD-7 Results No flowsheet data found.  PHQ-9 Scores PHQ9 SCORE ONLY 02/22/2020  PHQ-9 Total Score 10    ---------------------------------------------------------------------------------------------------  Patient Active Problem List   Diagnosis Date Noted  . Gastroesophageal reflux disease 04/26/2020  . Vitamin D deficiency 03/02/2020  . Screen for STD (sexually transmitted disease) 03/02/2020  . Panic attacks 02/22/2020  . Anxiety 02/22/2020   History reviewed. No pertinent past medical history. No Known Allergies      Medications: Outpatient Medications Prior to Visit  Medication Sig  . [DISCONTINUED] escitalopram (LEXAPRO) 10 MG tablet Take 1 tablet (10 mg total) by mouth daily.  . [DISCONTINUED] LORazepam (ATIVAN) 0.5 MG tablet Take 1 tablet (0.5 mg total) by mouth every 8 (eight) hours. Will cause drowsiness.  . Vitamin D, Ergocalciferol, (DRISDOL) 1.25 MG (50000 UNIT) CAPS capsule Take 1 capsule (50,000 Units total) by mouth every 7 (seven) days. (taking one tablet per week) walk in lab in office 1-2 weeks after completing prescription. (Patient not taking: No sig reported)   No facility-administered medications prior to visit.    Review of Systems  Constitutional: Negative.   HENT: Negative.   Eyes: Negative.   Respiratory: Negative.   Cardiovascular: Negative.   Gastrointestinal: Negative.   Genitourinary: Negative.   Musculoskeletal: Negative.   Neurological: Negative.   Psychiatric/Behavioral: Negative for agitation, behavioral problems, confusion, decreased concentration, dysphoric mood, hallucinations, self-injury, sleep disturbance and suicidal ideas. The patient is nervous/anxious. The patient is not hyperactive.     Last CBC Lab Results  Component Value Date   WBC 7.8 02/22/2020   HGB 15.6 02/22/2020   HCT 47.8 02/22/2020   MCV 82 02/22/2020   MCH 26.7 02/22/2020   RDW 12.1 02/22/2020   PLT 360 02/22/2020   Last metabolic panel Lab Results  Component Value Date   GLUCOSE 66 02/22/2020   NA 140 02/22/2020   K 5.3 (H) 02/22/2020   CL 100 02/22/2020   CO2 25 02/22/2020   BUN 13 02/22/2020   CREATININE 1.03 02/22/2020   GFRNONAA 94 02/22/2020   GFRAA 108 02/22/2020   CALCIUM 10.0 02/22/2020  PROT 7.1 02/22/2020   ALBUMIN 4.9 02/22/2020   LABGLOB 2.2 02/22/2020   AGRATIO 2.2 02/22/2020   BILITOT 0.5 02/22/2020   ALKPHOS 130 (H) 02/22/2020   AST 17 02/22/2020   ALT 20 02/22/2020   ANIONGAP 10 09/04/2017   Last lipids Lab Results  Component Value Date   CHOL 154  02/22/2020   HDL 58 02/22/2020   LDLCALC 83 02/22/2020   TRIG 62 02/22/2020   CHOLHDL 2.7 02/22/2020   Last hemoglobin A1c No results found for: HGBA1C Last thyroid functions Lab Results  Component Value Date   TSH 0.605 02/22/2020   Last vitamin D Lab Results  Component Value Date   VD25OH 32.8 02/22/2020   Last vitamin B12 and Folate No results found for: VITAMINB12, FOLATE     Objective    BP 111/72   Pulse 68   Temp 98.1 F (36.7 C) (Oral)   Resp 16   Wt 155 lb 9.6 oz (70.6 kg)   SpO2 100%   BMI 23.31 kg/m  BP Readings from Last 3 Encounters:  04/25/20 111/72  03/28/20 117/77  02/22/20 (!) 152/97   Wt Readings from Last 3 Encounters:  04/25/20 155 lb 9.6 oz (70.6 kg)  03/28/20 150 lb 6.4 oz (68.2 kg)  02/22/20 149 lb 6.4 oz (67.8 kg)       Physical Exam Vitals and nursing note reviewed.  Constitutional:      General: He is not in acute distress.    Appearance: Normal appearance. He is well-developed. He is not ill-appearing, toxic-appearing or diaphoretic.     Comments: Patient is alert and oriented and responsive to questions Engages in eye contact with provider. Speaks in full sentences without any pauses without any shortness of breath or distress.    HENT:     Head: Normocephalic and atraumatic.     Right Ear: Hearing, tympanic membrane, ear canal and external ear normal.     Left Ear: Hearing, tympanic membrane, ear canal and external ear normal.     Nose: Nose normal.     Mouth/Throat:     Pharynx: Uvula midline. No oropharyngeal exudate.  Eyes:     General: Lids are normal. No scleral icterus.       Right eye: No discharge.        Left eye: No discharge.     Conjunctiva/sclera: Conjunctivae normal.     Pupils: Pupils are equal, round, and reactive to light.  Neck:     Thyroid: No thyromegaly.     Vascular: Normal carotid pulses. No carotid bruit, hepatojugular reflux or JVD.     Trachea: Trachea and phonation normal. No tracheal tenderness  or tracheal deviation.     Meningeal: Brudzinski's sign absent.  Cardiovascular:     Rate and Rhythm: Normal rate and regular rhythm.     Pulses: Normal pulses.     Heart sounds: Normal heart sounds, S1 normal and S2 normal. Heart sounds not distant. No murmur heard. No friction rub. No gallop.   Pulmonary:     Effort: Pulmonary effort is normal. No accessory muscle usage or respiratory distress.     Breath sounds: Normal breath sounds. No stridor. No wheezing or rales.  Chest:     Chest wall: No tenderness.  Abdominal:     General: Bowel sounds are normal. There is no distension.     Palpations: Abdomen is soft. There is no mass.     Tenderness: There is no abdominal tenderness. There is no  guarding or rebound.  Genitourinary:    Comments: No gynecology exams done in this office currently and no equipment available. Patient is aware she will have to see gynecology if needed for any pelvic/vaginal exam and will follow up with gynecology/obgyn as needed. Yearly gynecology pelvic exam recommended. Patient verbalized understanding of instructions and denies any further questions at this time.   Musculoskeletal:        General: No swelling, tenderness or deformity. Normal range of motion.     Cervical back: Full passive range of motion without pain, normal range of motion and neck supple.     Right lower leg: No edema.     Left lower leg: No edema.     Comments: Patient moves on and off of exam table and in room without difficulty. Gait is normal in hall and in room. Patient is oriented to person place time and situation. Patient answers questions appropriately and engages in conversation.   Lymphadenopathy:     Head:     Right side of head: No submental, submandibular, tonsillar, preauricular, posterior auricular or occipital adenopathy.     Left side of head: No submental, submandibular, tonsillar, preauricular, posterior auricular or occipital adenopathy.     Cervical: No cervical  adenopathy.  Skin:    General: Skin is warm and dry.     Capillary Refill: Capillary refill takes less than 2 seconds.     Coloration: Skin is not pale.     Findings: No erythema or rash.     Nails: There is no clubbing.  Neurological:     General: No focal deficit present.     Mental Status: He is alert and oriented to person, place, and time.     GCS: GCS eye subscore is 4. GCS verbal subscore is 5. GCS motor subscore is 6.     Cranial Nerves: No cranial nerve deficit.     Sensory: No sensory deficit.     Motor: No abnormal muscle tone.     Coordination: Coordination normal.     Deep Tendon Reflexes: Reflexes are normal and symmetric. Reflexes normal.  Psychiatric:        Speech: Speech normal.        Behavior: Behavior normal.        Thought Content: Thought content normal.        Judgment: Judgment normal.        No results found for any visits on 04/25/20.  Assessment & Plan     Gastroesophageal reflux disease, unspecified whether esophagitis present - Plan: omeprazole (PRILOSEC) 40 MG capsule  Panic attacks  Anxiety Will increase Lexapro to 15 mg by mouth per once per day. Discontinue Lexapro 10 mg daily.  Refill on   Meds ordered this encounter  Medications  . escitalopram (LEXAPRO) 5 MG tablet    Sig: Take 3 tablets (15 mg total) by mouth daily.    Dispense:  90 tablet    Refill:  0  . omeprazole (PRILOSEC) 40 MG capsule    Sig: Take 1 capsule (40 mg total) by mouth daily.    Dispense:  90 capsule    Refill:  1     Discussed known black box warning for anti depression/ anxiety medication. Need to report any behavioral changes right, if any homicidal or suicidal thoughts or ideas seek medical attention right away. Call 911.   Red Flags discussed. The patient was given clear instructions to go to ER or return to medical center if any  red flags develop, symptoms do not improve, worsen or new problems develop. They verbalized understanding.  Return in about 2  months (around 06/25/2020), or if symptoms worsen or fail to improve, for at any time for any worsening symptoms, Go to Emergency room/ urgent care if worse.     The entirety of the information documented in the History of Present Illness, Review of Systems and Physical Exam were personally obtained by me. Portions of this information were initially documented by the CMA and reviewed by me for thoroughness and accuracy.    Jairo Ben, FNP  [facility] [phone] (phone) [phone] (fax)  [facility]

## 2020-04-25 NOTE — Patient Instructions (Addendum)
Omeprazole Tablets What is this medicine? OMEPRAZOLE (oh ME pray zol) prevents the production of acid in the stomach. It is used to treat the symptoms of heartburn. You can buy this medicine without a prescription. This product is not for long-term use, unless otherwise directed by your doctor or health care professional. This medicine may be used for other purposes; ask your health care provider or pharmacist if you have questions. COMMON BRAND NAME(S): Prilosec OTC What should I tell my health care provider before I take this medicine? They need to know if you have any of these conditions:  black or bloody stools  chest pain  difficulty swallowing  have had heartburn for over 3 months  have heartburn with dizziness, lightheadedness or sweating  liver disease  lupus  stomach pain  unexplained weight loss  vomiting with blood  wheezing  an unusual or allergic reaction to omeprazole, other medicines, foods, dyes, or preservatives  pregnant or trying to get pregnant  breast-feeding How should I use this medicine? Take this medicine by mouth with a glass of water. Follow the directions on the product label. Do not cut, crush or chew this medicine. Swallow the tablets whole. Take this medicine on an empty stomach, at least 30 minutes before breakfast. Take your medicine at regular intervals. Do not take it more often than directed. Talk to your pediatrician regarding the use of this medicine in children. Special care may be needed. Overdosage: If you think you have taken too much of this medicine contact a poison control center or emergency room at once. NOTE: This medicine is only for you. Do not share this medicine with others. What if I miss a dose? If you miss a dose, take it as soon as you can. If it is almost time for your next dose, take only that dose. Do not take double or extra doses. What may interact with this medicine? Do not take this medicine with any of the  following medications:  atazanavir  clopidogrel  nelfinavir  rilpivirine This medicine may also interact with the following medications:  antifungals like itraconazole, ketoconazole, and voriconazole  certain antivirals for HIV or hepatitis  certain medicines that treat or prevent blood clots like warfarin  cilostazol  citalopram  cyclosporine  dasatinib  digoxin  disulfiram  diuretics  erlotinib  iron supplements  medicines for anxiety, panic, and sleep like diazepam  medicines for seizures like carbamazepine, phenobarbital, phenytoin  methotrexate  mycophenolate mofetil  nilotinib  rifampin  St. John's wort  tacrolimus  vitamin B12 This list may not describe all possible interactions. Give your health care provider a list of all the medicines, herbs, non-prescription drugs, or dietary supplements you use. Also tell them if you smoke, drink alcohol, or use illegal drugs. Some items may interact with your medicine. What should I watch for while using this medicine? It can take several days before your heartburn gets better. Tell your healthcare professional if your symptoms do not start to get better or if they get worse. If you need to take this medicine for more than 14 days, talk to your healthcare professional. Heartburn may sometimes be caused by a more serious condition. This medicine may cause a decrease in vitamin B12. You should make sure that you get enough vitamin B12 while you are taking this medicine. Discuss the foods you eat and the vitamins you take with your health care professional. What side effects may I notice from receiving this medicine? Side effects that you  should report to your doctor or health care professional as soon as possible:  allergic reactions like skin rash, itching or hives, swelling of the face, lips, or tongue  bone pain  breathing problems  fever or sore throat  joint pain  rash on cheeks or arms that gets  worse in the sun  redness, blistering, peeling, or loosening of the skin, including inside the mouth  severe diarrhea  signs and symptoms of kidney injury like trouble passing urine or change in the amount of urine  signs and symptoms of low magnesium like muscle cramps; muscle pain; muscle weakness; tremors; seizures; or fast, irregular heartbeat  stomach polyps  unusual bleeding or bruising Side effects that usually do not require medical attention (report to your doctor or health care professional if they continue or are bothersome):  diarrhea  dry mouth  gas  headache  nausea  stomach pain This list may not describe all possible side effects. Call your doctor for medical advice about side effects. You may report side effects to FDA at 1-800-FDA-1088. Where should I keep my medicine? Keep out of the reach of children. Store at room temperature between 20 and 25 degrees C (68 and 77 degrees F). Protect from light and moisture. Throw away any unused medicine after the expiration date. NOTE: This sheet is a summary. It may not cover all possible information. If you have questions about this medicine, talk to your doctor, pharmacist, or health care provider.  2021 Elsevier/Gold Standard (2019-12-12 18:47:00) Food Choices for Gastroesophageal Reflux Disease, Adult When you have gastroesophageal reflux disease (GERD), the foods you eat and your eating habits are very important. Choosing the right foods can help ease your discomfort. Think about working with a food expert (dietitian) to help you make good choices. What are tips for following this plan? Reading food labels  Look for foods that are low in saturated fat. Foods that may help with your symptoms include: ? Foods that have less than 5% of daily value (DV) of fat. ? Foods that have 0 grams of trans fat. Cooking  Do not fry your food.  Cook your food by baking, steaming, grilling, or broiling. These are all methods  that do not need a lot of fat for cooking.  To add flavor, try to use herbs that are low in spice and acidity. Meal planning  Choose healthy foods that are low in fat, such as: ? Fruits and vegetables. ? Whole grains. ? Low-fat dairy products. ? Lean meats, fish, and poultry.  Eat small meals often instead of eating 3 large meals each day. Eat your meals slowly in a place where you are relaxed. Avoid bending over or lying down until 2-3 hours after eating.  Limit high-fat foods such as fatty meats or fried foods.  Limit your intake of fatty foods, such as oils, butter, and shortening.  Avoid the following as told by your doctor: ? Foods that cause symptoms. These may be different for different people. Keep a food diary to keep track of foods that cause symptoms. ? Alcohol. ? Drinking a lot of liquid with meals. ? Eating meals during the 2-3 hours before bed.   Lifestyle  Stay at a healthy weight. Ask your doctor what weight is healthy for you. If you need to lose weight, work with your doctor to do so safely.  Exercise for at least 30 minutes on 5 or more days each week, or as told by your doctor.  Wear loose-fitting  clothes.  Do not smoke or use any products that contain nicotine or tobacco. If you need help quitting, ask your doctor.  Sleep with the head of your bed higher than your feet. Use a wedge under the mattress or blocks under the bed frame to raise the head of the bed.  Chew sugar-free gum after meals. What foods should eat? Eat a healthy, well-balanced diet of fruits, vegetables, whole grains, low-fat dairy products, lean meats, fish, and poultry. Each person is different. Foods that may cause symptoms in one person may not cause any symptoms in another person. Work with your doctor to find foods that are safe for you. The items listed above may not be a complete list of what you can eat and drink. Contact a food expert for more options.   What foods should I  avoid? Limiting some of these foods may help in managing the symptoms of GERD. Everyone is different. Talk with a food expert or your doctor to help you find the exact foods to avoid, if any. Fruits Any fruits prepared with added fat. Any fruits that cause symptoms. For some people, this may include citrus fruits, such as oranges, grapefruit, pineapple, and lemons. Vegetables Deep-fried vegetables. [place] fries. Any vegetables prepared with added fat. Any vegetables that cause symptoms. For some people, this may include tomatoes and tomato products, chili peppers, onions and garlic, and horseradish. Grains Pastries or quick breads with added fat. Meats and other proteins High-fat meats, such as fatty beef or pork, hot dogs, ribs, ham, sausage, salami, and bacon. Fried meat or protein, including fried fish and fried chicken. Nuts and nut butters, in large amounts. Dairy Whole milk and chocolate milk. Sour cream. Cream. Ice cream. Cream cheese. Milkshakes. Fats and oils Butter. Margarine. Shortening. Ghee. Beverages Coffee and tea, with or without caffeine. Carbonated beverages. Sodas. Energy drinks. Fruit juice made with acidic fruits, such as orange or grapefruit. Tomato juice. Alcoholic drinks. Sweets and desserts Chocolate and cocoa. Donuts. Seasonings and condiments Pepper. Peppermint and spearmint. Added salt. Any condiments, herbs, or seasonings that cause symptoms. For some people, this may include curry, hot sauce, or vinegar-based salad dressings. The items listed above may not be a complete list of what you should not eat and drink. Contact a food expert for more options. Questions to ask your doctor Diet and lifestyle changes are often the first steps that are taken to manage symptoms of GERD. If diet and lifestyle changes do not help, talk with your doctor about taking medicines. Where to find more information  International Foundation for Gastrointestinal Disorders:  aboutgerd.org Summary  When you have GERD, food and lifestyle choices are very important in easing your symptoms.  Eat small meals often instead of 3 large meals a day. Eat your meals slowly and in a place where you are relaxed.  Avoid bending over or lying down until 2-3 hours after eating.  Limit high-fat foods such as fatty meats or fried foods. This information is not intended to replace advice given to you by your health care provider. Make sure you discuss any questions you have with your health care provider. Document Revised: 08/13/2019 Document Reviewed: 08/13/2019 Elsevier Patient Education  2021 Elsevier Inc. Lorazepam tablets What is this medicine? LORAZEPAM (lor A ze pam) is a benzodiazepine. It is used to treat anxiety. This medicine may be used for other purposes; ask your health care provider or pharmacist if you have questions. COMMON BRAND NAME(S): Ativan What should I tell my  health care provider before I take this medicine? They need to know if you have any of these conditions:  glaucoma  history of drug or alcohol abuse problem  kidney disease  liver disease  lung or breathing disease, like asthma  mental illness  myasthenia gravis  Parkinson's disease  suicidal thoughts, plans, or attempt; a previous suicide attempt by you or a family member  an unusual or allergic reaction to lorazepam, other medicines, foods, dyes, or preservatives  pregnant or trying to get pregnant  breast-feeding How should I use this medicine? Take this medicine by mouth with a glass of water. Follow the directions on the prescription label. Take your medicine at regular intervals. Do not take it more often than directed. Do not stop taking except on your doctor's advice. A special MedGuide will be given to you by the pharmacist with each prescription and refill. Be sure to read this information carefully each time. Talk to your pediatrician regarding the use of this  medicine in children. While this drug may be used in children as young as 12 years for selected conditions, precautions do apply. Overdosage: If you think you have taken too much of this medicine contact a poison control center or emergency room at once. NOTE: This medicine is only for you. Do not share this medicine with others. What if I miss a dose? If you miss a dose, take it as soon as you can. If it is almost time for your next dose, take only that dose. Do not take double or extra doses. What may interact with this medicine? Do not take this medicine with any of the following medications:  narcotic medicines for cough  sodium oxybate This medicine may also interact with the following medications:  alcohol  antihistamines for allergy, cough and cold  certain medicines for anxiety or sleep  certain medicines for depression, like amitriptyline, fluoxetine, sertraline  certain medicines for seizures like carbamazepine, phenobarbital, phenytoin, primidone  general anesthetics like lidocaine, pramoxine, tetracaine  MAOIs like Carbex, Eldepryl, Marplan, Nardil, and Parnate  medicines that relax muscles for surgery  narcotic medicines for pain  phenothiazines like chlorpromazine, mesoridazine, prochlorperazine, thioridazine This list may not describe all possible interactions. Give your health care provider a list of all the medicines, herbs, non-prescription drugs, or dietary supplements you use. Also tell them if you smoke, drink alcohol, or use illegal drugs. Some items may interact with your medicine. What should I watch for while using this medicine? Tell your doctor or health care professional if your symptoms do not start to get better or if they get worse. Do not stop taking except on your doctor's advice. You may develop a severe reaction. Your doctor will tell you how much medicine to take. You may get drowsy or dizzy. Do not drive, use machinery, or do anything that  needs mental alertness until you know how this medicine affects you. To reduce the risk of dizzy and fainting spells, do not stand or sit up quickly, especially if you are an older patient. Alcohol may increase dizziness and drowsiness. Avoid alcoholic drinks. If you are taking another medicine that also causes drowsiness, you may have more side effects. Give your health care provider a list of all medicines you use. Your doctor will tell you how much medicine to take. Do not take more medicine than directed. Call emergency for help if you have problems breathing or unusual sleepiness. What side effects may I notice from receiving this medicine? Side effects  that you should report to your doctor or health care professional as soon as possible:  allergic reactions like skin rash, itching or hives, swelling of the face, lips, or tongue  breathing problems  confusion  loss of balance or coordination  signs and symptoms of low blood pressure like dizziness; feeling faint or lightheaded, falls; unusually weak or tired  suicidal thoughts or other mood changes Side effects that usually do not require medical attention (report to your doctor or health care professional if they continue or are bothersome):  dizziness  headache  nausea, vomiting  tiredness This list may not describe all possible side effects. Call your doctor for medical advice about side effects. You may report side effects to FDA at 1-800-FDA-1088. Where should I keep my medicine? Keep out of the reach of children. This medicine can be abused. Keep your medicine in a safe place to protect it from theft. Do not share this medicine with anyone. Selling or giving away this medicine is dangerous and against the law. This medicine may cause accidental overdose and death if taken by other adults, children, or pets. Mix any unused medicine with a substance like cat litter or coffee grounds. Then throw the medicine away in a sealed  container like a sealed bag or a coffee can with a lid. Do not use the medicine after the expiration date. Store at room temperature between 20 and 25 degrees C (68 and 77 degrees F). Protect from light. Keep container tightly closed. NOTE: This sheet is a summary. It may not cover all possible information. If you have questions about this medicine, talk to your doctor, pharmacist, or health care provider.  2021 Elsevier/Gold Standard (2014-10-31 15:54:27) Escitalopram Tablets What is this medicine? ESCITALOPRAM (es sye TAL oh pram) is used to treat depression and certain types of anxiety. This medicine may be used for other purposes; ask your health care provider or pharmacist if you have questions. COMMON BRAND NAME(S): Lexapro What should I tell my health care provider before I take this medicine? They need to know if you have any of these conditions:  bipolar disorder or a family history of bipolar disorder  diabetes  glaucoma  heart disease  kidney or liver disease  receiving electroconvulsive therapy  seizures (convulsions)  suicidal thoughts, plans, or attempt by you or a family member  an unusual or allergic reaction to escitalopram, the related drug citalopram, other medicines, foods, dyes, or preservatives  pregnant or trying to become pregnant  breast-feeding How should I use this medicine? Take this medicine by mouth with a glass of water. Follow the directions on the prescription label. You can take it with or without food. If it upsets your stomach, take it with food. Take your medicine at regular intervals. Do not take it more often than directed. Do not stop taking this medicine suddenly except upon the advice of your doctor. Stopping this medicine too quickly may cause serious side effects or your condition may worsen. A special MedGuide will be given to you by the pharmacist with each prescription and refill. Be sure to read this information carefully each  time. Talk to your pediatrician regarding the use of this medicine in children. Special care may be needed. Overdosage: If you think you have taken too much of this medicine contact a poison control center or emergency room at once. NOTE: This medicine is only for you. Do not share this medicine with others. What if I miss a dose? If you miss  a dose, take it as soon as you can. If it is almost time for your next dose, take only that dose. Do not take double or extra doses. What may interact with this medicine? Do not take this medicine with any of the following medications:  certain medicines for fungal infections like fluconazole, itraconazole, ketoconazole, posaconazole, voriconazole  cisapride  citalopram  dronedarone  linezolid  MAOIs like Carbex, Eldepryl, Marplan, Nardil, and Parnate  methylene blue (injected into a vein)  pimozide  thioridazine This medicine may also interact with the following medications:  alcohol  amphetamines  aspirin and aspirin-like medicines  carbamazepine  certain medicines for depression, anxiety, or psychotic disturbances  certain medicines for migraine headache like almotriptan, eletriptan, frovatriptan, naratriptan, rizatriptan, sumatriptan, zolmitriptan  certain medicines for sleep  certain medicines that treat or prevent blood clots like warfarin, enoxaparin, dalteparin  cimetidine  diuretics  dofetilide  fentanyl  furazolidone  isoniazid  lithium  metoprolol  NSAIDs, medicines for pain and inflammation, like ibuprofen or naproxen  other medicines that prolong the QT interval (cause an abnormal heart rhythm)  procarbazine  rasagiline  supplements like St. John's wort, kava kava, valerian  tramadol  tryptophan  ziprasidone This list may not describe all possible interactions. Give your health care provider a list of all the medicines, herbs, non-prescription drugs, or dietary supplements you use. Also tell  them if you smoke, drink alcohol, or use illegal drugs. Some items may interact with your medicine. What should I watch for while using this medicine? Tell your doctor if your symptoms do not get better or if they get worse. Visit your doctor or health care professional for regular checks on your progress. Because it may take several weeks to see the full effects of this medicine, it is important to continue your treatment as prescribed by your doctor. Patients and their families should watch out for new or worsening thoughts of suicide or depression. Also watch out for sudden changes in feelings such as feeling anxious, agitated, panicky, irritable, hostile, aggressive, impulsive, severely restless, overly excited and hyperactive, or not being able to sleep. If this happens, especially at the beginning of treatment or after a change in dose, call your health care professional. Bonita Quin may get drowsy or dizzy. Do not drive, use machinery, or do anything that needs mental alertness until you know how this medicine affects you. Do not stand or sit up quickly, especially if you are an older patient. This reduces the risk of dizzy or fainting spells. Alcohol may interfere with the effect of this medicine. Avoid alcoholic drinks. Your mouth may get dry. Chewing sugarless gum or sucking hard candy, and drinking plenty of water may help. Contact your doctor if the problem does not go away or is severe. What side effects may I notice from receiving this medicine? Side effects that you should report to your doctor or health care professional as soon as possible:  allergic reactions like skin rash, itching or hives, swelling of the face, lips, or tongue  anxious  black, tarry stools  changes in vision  confusion  elevated mood, decreased need for sleep, racing thoughts, impulsive behavior  eye pain  fast, irregular heartbeat  feeling faint or lightheaded, falls  feeling agitated, angry, or  irritable  hallucination, loss of contact with reality  loss of balance or coordination  loss of memory  painful or prolonged erections  restlessness, pacing, inability to keep still  seizures  stiff muscles  suicidal thoughts or other  mood changes  trouble sleeping  unusual bleeding or bruising  unusually weak or tired  vomiting Side effects that usually do not require medical attention (report to your doctor or health care professional if they continue or are bothersome):  changes in appetite  change in sex drive or performance  headache  increased sweating  indigestion, nausea  tremors This list may not describe all possible side effects. Call your doctor for medical advice about side effects. You may report side effects to FDA at 1-800-FDA-1088. Where should I keep my medicine? Keep out of reach of children. Store at room temperature between 15 and 30 degrees C (59 and 86 degrees F). Throw away any unused medicine after the expiration date. NOTE: This sheet is a summary. It may not cover all possible information. If you have questions about this medicine, talk to your doctor, pharmacist, or health care provider.  2021 Elsevier/Gold Standard (2019-12-24 09:53:34)

## 2020-04-25 NOTE — Telephone Encounter (Signed)
Requested medication (s) are due for refill today: yes  Requested medication (s) are on the active medication list: yes  Last refill:  03/28/20 #60 0 refills  Future visit scheduled: yes  Notes to clinic:  not delegated per protocol     Requested Prescriptions  Pending Prescriptions Disp Refills   LORazepam (ATIVAN) 0.5 MG tablet [Pharmacy Med Name: LORAZEPAM 0.5 MG TABLET] 60 tablet 0    Sig: Take 1 tablet (0.5 mg total) by mouth every 8 (eight) hours. Will cause drowsiness.      Not Delegated - Psychiatry:  Anxiolytics/Hypnotics Failed - 04/25/2020 12:25 PM      Failed - This refill cannot be delegated      Failed - Urine Drug Screen completed in last 360 days      Passed - Valid encounter within last 6 months    Recent Outpatient Visits           Today Gastroesophageal reflux disease, unspecified whether esophagitis present   [facility], Eula Fried, FNP   4 weeks ago Panic attacks   [facility] Flinchum, Eula Fried, FNP   1 month ago Suspected COVID-19 virus infection   [facility], Marzella Schlein, MD   2 months ago Panic attacks    [facility], Eula Fried, FNP       Future Appointments             In 3 months Bacigalupo, Marzella Schlein, MD [facility]

## 2020-04-26 ENCOUNTER — Encounter: Payer: Self-pay | Admitting: Adult Health

## 2020-04-26 DIAGNOSIS — K219 Gastro-esophageal reflux disease without esophagitis: Secondary | ICD-10-CM | POA: Insufficient documentation

## 2020-05-12 ENCOUNTER — Encounter: Payer: Self-pay | Admitting: Adult Health

## 2020-05-17 ENCOUNTER — Other Ambulatory Visit: Payer: Self-pay | Admitting: Adult Health

## 2020-05-17 NOTE — Telephone Encounter (Signed)
Requested Prescriptions  Pending Prescriptions Disp Refills  . escitalopram (LEXAPRO) 5 MG tablet [Pharmacy Med Name: ESCITALOPRAM 5 MG TABLET] 270 tablet 0    Sig: TAKE 3 TABLETS (15 MG TOTAL) BY MOUTH DAILY.     Psychiatry:  Antidepressants - SSRI Passed - 05/17/2020  8:30 AM      Passed - Valid encounter within last 6 months    Recent Outpatient Visits          3 weeks ago Gastroesophageal reflux disease, unspecified whether esophagitis present   [facility] Flinchum, Eula Fried, FNP   1 month ago Panic attacks   [employer] [facility], Eula Fried, FNP   2 months ago Suspected COVID-19 virus infection   [facility], Marzella Schlein, MD   2 months ago Panic attacks   [facility] Flinchum, Eula Fried, FNP      Future Appointments            In 2 months Bacigalupo, Marzella Schlein, MD [facility], [facility]

## 2020-05-19 ENCOUNTER — Other Ambulatory Visit: Payer: Self-pay | Admitting: Adult Health

## 2020-05-19 NOTE — Telephone Encounter (Signed)
Requested medication (s) are due for refill today: yes  Requested medication (s) are on the active medication list: yes  Last refill:  02/25/2020  Future visit scheduled: yes  Notes to clinic:  50,000 IU strengths are not delegated  Requested Prescriptions  Pending Prescriptions Disp Refills   Vitamin D, Ergocalciferol, (DRISDOL) 1.25 MG (50000 UNIT) CAPS capsule [Pharmacy Med Name: VITAMIN D2 1.25MG (50,000 UNIT)] 12 capsule 0    Sig: TAKE 1 CAPSULE BY MOUTH EVERY 7 (SEVEN) DAYS WALK IN LAB IN OFFICE 1-2 WEEKS AFTER COMPLETING      Endocrinology:  Vitamins - Vitamin D Supplementation Failed - 05/19/2020  8:30 AM      Failed - 50,000 IU strengths are not delegated      Failed - Phosphate in normal range and within 360 days    No results found for: PHOS        Passed - Ca in normal range and within 360 days    Calcium  Date Value Ref Range Status  02/22/2020 10.0 8.7 - 10.2 mg/dL Final   Calcium, Total  Date Value Ref Range Status  03/10/2012 9.1 8.5 - 10.1 mg/dL Final          Passed - Vitamin D in normal range and within 360 days    Vit D, 25-Hydroxy  Date Value Ref Range Status  02/22/2020 32.8 30.0 - 100.0 ng/mL Final    Comment:    Vitamin D deficiency has been defined by the Institute of Medicine and an Endocrine Society practice guideline as a level of serum 25-OH vitamin D less than 20 ng/mL (1,2). The Endocrine Society went on to further define vitamin D insufficiency as a level between 21 and 29 ng/mL (2). 1. IOM (Institute of Medicine). 2010. Dietary reference    intakes for calcium and D. [place]: The    [employer]. 2. Holick MF, Binkley Seward, Bischoff-Ferrari HA, et al.    Evaluation, treatment, and prevention of vitamin D    deficiency: an Endocrine Society clinical practice    guideline. JCEM. 2011 Jul; 96(7):1911-30.           Passed - Valid encounter within last 12 months    Recent Outpatient Visits           3 weeks ago  Gastroesophageal reflux disease, unspecified whether esophagitis present   [facility], Eula Fried, FNP   1 month ago Panic attacks   [employer] [facility], Eula Fried, FNP   2 months ago Suspected COVID-19 virus infection   [facility], Marzella Schlein, MD   2 months ago Panic attacks   [facility] Flinchum, Eula Fried, FNP       Future Appointments             In 2 months Bacigalupo, Marzella Schlein, MD [facility], [facility]

## 2020-06-16 ENCOUNTER — Encounter: Payer: Self-pay | Admitting: Family Medicine

## 2020-06-16 ENCOUNTER — Encounter: Payer: Self-pay | Admitting: Adult Health

## 2020-06-16 DIAGNOSIS — F419 Anxiety disorder, unspecified: Secondary | ICD-10-CM

## 2020-06-16 DIAGNOSIS — F410 Panic disorder [episodic paroxysmal anxiety] without agoraphobia: Secondary | ICD-10-CM

## 2020-06-16 MED ORDER — LORAZEPAM 0.5 MG PO TABS: 0.5000 mg | ORAL_TABLET | Freq: Three times a day (TID) | ORAL | 1 refills | Status: DC

## 2020-08-07 ENCOUNTER — Encounter: Payer: Self-pay | Admitting: Family Medicine

## 2020-08-07 DIAGNOSIS — K219 Gastro-esophageal reflux disease without esophagitis: Secondary | ICD-10-CM

## 2020-08-08 ENCOUNTER — Ambulatory Visit: Payer: Self-pay | Admitting: Family Medicine

## 2020-08-08 ENCOUNTER — Other Ambulatory Visit: Payer: Self-pay | Admitting: Family Medicine

## 2020-08-08 DIAGNOSIS — K219 Gastro-esophageal reflux disease without esophagitis: Secondary | ICD-10-CM

## 2020-08-08 MED ORDER — OMEPRAZOLE 40 MG PO CPDR: 40.0000 mg | DELAYED_RELEASE_CAPSULE | Freq: Every day | ORAL | 1 refills | Status: DC

## 2020-08-08 NOTE — Telephone Encounter (Signed)
   Notes to clinic Needs P/A completed per pharmacy.

## 2020-08-11 NOTE — Telephone Encounter (Signed)
PA done and send to [facility]. Outcome is pending.

## 2020-08-11 NOTE — Telephone Encounter (Signed)
Approved today  Your PA request has been approved. Additional information will be provided in the approval communication. (Message 1145)

## 2020-08-11 NOTE — Telephone Encounter (Signed)
Pharmacy has been notified of approved PA.

## 2020-10-17 ENCOUNTER — Ambulatory Visit: Payer: BC Managed Care – PPO | Admitting: Family Medicine

## 2020-10-17 ENCOUNTER — Encounter: Payer: Self-pay | Admitting: Family Medicine

## 2020-10-17 ENCOUNTER — Other Ambulatory Visit: Payer: Self-pay

## 2020-10-17 VITALS — BP 115/73 | HR 55 | Temp 98.2°F | Resp 16 | Wt 169.2 lb

## 2020-10-17 DIAGNOSIS — F410 Panic disorder [episodic paroxysmal anxiety] without agoraphobia: Secondary | ICD-10-CM | POA: Diagnosis not present

## 2020-10-17 DIAGNOSIS — K219 Gastro-esophageal reflux disease without esophagitis: Secondary | ICD-10-CM | POA: Diagnosis not present

## 2020-10-17 DIAGNOSIS — F411 Generalized anxiety disorder: Secondary | ICD-10-CM

## 2020-10-17 MED ORDER — OMEPRAZOLE 40 MG PO CPDR: 40.0000 mg | DELAYED_RELEASE_CAPSULE | Freq: Every day | ORAL | 1 refills | Status: DC

## 2020-10-17 MED ORDER — LORAZEPAM 0.5 MG PO TABS
0.5000 mg | ORAL_TABLET | Freq: Three times a day (TID) | ORAL | 2 refills | Status: DC | PRN
Start: 2020-10-17 — End: 2020-11-05

## 2020-10-17 MED ORDER — ESCITALOPRAM OXALATE 20 MG PO TABS: 20.0000 mg | ORAL_TABLET | Freq: Every day | ORAL | 1 refills | Status: DC

## 2020-10-17 NOTE — Progress Notes (Signed)
Established patient visit   Patient: Todd Fleming   DOB: Jan 20, 1986   35 y.o. Male  MRN: [id number] Visit Date: 10/17/2020  Today's healthcare provider: Shirlee Latch, MD   Chief Complaint  Patient presents with   Anxiety    Subjective  -------------------------------------------------------------------------------------------------------------------- Anxiety Patient reports no chest pain, dizziness, nausea, palpitations or shortness of breath.     Anxiety, Follow-up  He was last seen for anxiety 5 months ago. Changes made at last visit include increase Lexapro 15 mg daily.  He ran out of lexapro x 2 days and his anxiety has since worsened. He was doing well on lexapro.  He takes ativan every other day for panic symptoms or as needed.  He reports excellent compliance with treatment. He reports excellent tolerance of treatment. He is not having side effects.   He feels his anxiety is moderate and Worse since last visit.  Symptoms: No chest pain No difficulty concentrating  No dizziness No fatigue  No feelings of losing control No insomnia  Yes irritable No palpitations  Yes panic attacks Yes racing thoughts  Yes shortness of breath No sweating  No tremors/shakes    GAD-7 Results No flowsheet data found.  PHQ-9 Scores PHQ9 SCORE ONLY 02/22/2020  PHQ-9 Total Score 10   He declines the flu vaccine.   He is taking prilosec for reflux and denies adverse affects. He is requesting for a refill. ---------------------------------------------------------------------------------------------------     Medications: Outpatient Medications Prior to Visit  Medication Sig   [DISCONTINUED] escitalopram (LEXAPRO) 5 MG tablet TAKE 3 TABLETS (15 MG TOTAL) BY MOUTH DAILY.   [DISCONTINUED] LORazepam (ATIVAN) 0.5 MG tablet Take 1 tablet (0.5 mg total) by mouth every 8 (eight) hours. Will cause drowsiness.   [DISCONTINUED] omeprazole (PRILOSEC) 40 MG capsule TAKE 1  CAPSULE (40 MG TOTAL) BY MOUTH DAILY.   [DISCONTINUED] Vitamin D, Ergocalciferol, (DRISDOL) 1.25 MG (50000 UNIT) CAPS capsule Take 1 capsule (50,000 Units total) by mouth every 7 (seven) days. (taking one tablet per week) walk in lab in office 1-2 weeks after completing prescription. (Patient not taking: Reported on 10/17/2020)   No facility-administered medications prior to visit.    Review of Systems  Constitutional:  Negative for chills, fatigue and fever.  HENT:  Negative for ear pain, sinus pressure, sinus pain and sore throat.   Eyes:  Negative for pain and visual disturbance.  Respiratory:  Negative for cough, chest tightness, shortness of breath and wheezing.   Cardiovascular:  Negative for chest pain, palpitations and leg swelling.  Gastrointestinal:  Negative for abdominal pain, blood in stool, diarrhea, nausea and vomiting.  Genitourinary:  Negative for flank pain, frequency and urgency.  Musculoskeletal:  Negative for back pain, myalgias and neck pain.  Neurological:  Negative for dizziness, weakness, light-headedness, numbness and headaches.       Objective  -------------------------------------------------------------------------------------------------------------------- BP 115/73   Pulse (!) 55   Temp 98.2 F (36.8 C) (Oral)   Resp 16   Wt 169 lb 3.2 oz (76.7 kg)   SpO2 100%   BMI 25.35 kg/m  BP Readings from Last 3 Encounters:  10/17/20 115/73  04/25/20 111/72  03/28/20 117/77   Wt Readings from Last 3 Encounters:  10/17/20 169 lb 3.2 oz (76.7 kg)  04/25/20 155 lb 9.6 oz (70.6 kg)  03/28/20 150 lb 6.4 oz (68.2 kg)      Physical Exam Vitals reviewed.  Constitutional:      General: He is not in acute distress.  Appearance: Normal appearance. He is not diaphoretic.  HENT:     Head: Normocephalic and atraumatic.  Eyes:     General: No scleral icterus.    Conjunctiva/sclera: Conjunctivae normal.  Cardiovascular:     Rate and Rhythm: Normal rate and  regular rhythm.     Pulses: Normal pulses.     Heart sounds: Normal heart sounds. No murmur heard. Pulmonary:     Effort: Pulmonary effort is normal. No respiratory distress.     Breath sounds: Normal breath sounds. No wheezing or rhonchi.  Abdominal:     General: There is no distension.     Palpations: Abdomen is soft.     Tenderness: There is no abdominal tenderness.  Musculoskeletal:     Cervical back: Neck supple.     Right lower leg: No edema.     Left lower leg: No edema.  Lymphadenopathy:     Cervical: No cervical adenopathy.  Skin:    General: Skin is warm and dry.     Capillary Refill: Capillary refill takes less than 2 seconds.     Findings: No rash.  Neurological:     Mental Status: He is alert and oriented to person, place, and time.     Cranial Nerves: No cranial nerve deficit.  Psychiatric:        Mood and Affect: Mood normal.        Behavior: Behavior normal.      No results found for any visits on 10/17/20.  Assessment & Plan  ---------------------------------------------------------------------------------------------------------------------- Problem List Items Addressed This Visit       Digestive   Gastroesophageal reflux disease    Chronic and well controlled Continue PPI      Relevant Medications   omeprazole (PRILOSEC) 40 MG capsule     Other   Panic attacks    Fairly well controlled Decrease Lorazepam to 20 pills monthly Encourage him to minimize benzos      Relevant Medications   escitalopram (LEXAPRO) 20 MG tablet   LORazepam (ATIVAN) 0.5 MG tablet   GAD (generalized anxiety disorder) - Primary    Chronic and fairly well controlled Exacerbated by running out of lexapro Will resume at 20mg  dose instead of 15mg  Discussed minimizing Lorazepam as below Encourage therapy      Relevant Medications   escitalopram (LEXAPRO) 20 MG tablet   LORazepam (ATIVAN) 0.5 MG tablet     Return in about 4 months (around 02/16/2021) for CPE, With  new PCP.      I,Essence Turner,acting as a for 04/16/2021, MD.,have documented all relevant documentation on the behalf of Neurosurgeon, MD,as directed by  Shirlee Latch, MD while in the presence of Shirlee Latch, MD.  I, Shirlee Latch, MD, have reviewed all documentation for this visit. The documentation on 10/17/20 for the exam, diagnosis, procedures, and orders are all accurate and complete.   Dina Warbington, Shirlee Latch, MD, MPH [facility]

## 2020-10-17 NOTE — Assessment & Plan Note (Signed)
Fairly well controlled Decrease Lorazepam to 20 pills monthly Encourage him to minimize benzos

## 2020-10-17 NOTE — Assessment & Plan Note (Signed)
Chronic and fairly well controlled Exacerbated by running out of lexapro Will resume at 20mg  dose instead of 15mg  Discussed minimizing Lorazepam as below Encourage therapy

## 2020-10-17 NOTE — Assessment & Plan Note (Signed)
Chronic and well controlled  Continue PPI 

## 2020-11-05 ENCOUNTER — Encounter: Payer: Self-pay | Admitting: Family Medicine

## 2020-11-05 ENCOUNTER — Other Ambulatory Visit: Payer: Self-pay | Admitting: Family Medicine

## 2020-11-05 ENCOUNTER — Telehealth: Payer: Self-pay

## 2020-11-05 DIAGNOSIS — F410 Panic disorder [episodic paroxysmal anxiety] without agoraphobia: Secondary | ICD-10-CM

## 2020-11-05 MED ORDER — LORAZEPAM 0.5 MG PO TABS: 0.5000 mg | ORAL_TABLET | Freq: Every day | ORAL | 0 refills | Status: DC

## 2020-11-05 MED ORDER — LORAZEPAM 0.5 MG PO TABS: 0.5000 mg | ORAL_TABLET | Freq: Three times a day (TID) | ORAL | 0 refills | Status: DC | PRN

## 2020-11-05 MED ORDER — LORAZEPAM 0.5 MG PO TABS: 0.5000 mg | ORAL_TABLET | Freq: Three times a day (TID) | ORAL | 2 refills | Status: DC | PRN

## 2020-11-05 NOTE — Telephone Encounter (Signed)
Copied from CRM [id number]. Topic: General - Other >> Nov 05, 2020  2:26 PM Glean Salen wrote: Reason for [id number] from [facility] called about paitne medication ativna for his dot. Please call back

## 2021-03-16 ENCOUNTER — Encounter: Payer: BC Managed Care – PPO | Admitting: Family Medicine

## 2021-04-04 ENCOUNTER — Other Ambulatory Visit: Payer: Self-pay | Admitting: Family Medicine

## 2021-04-04 ENCOUNTER — Other Ambulatory Visit: Payer: Self-pay | Admitting: Adult Health

## 2021-04-04 DIAGNOSIS — K219 Gastro-esophageal reflux disease without esophagitis: Secondary | ICD-10-CM

## 2021-04-06 ENCOUNTER — Ambulatory Visit: Payer: BC Managed Care – PPO | Admitting: Family Medicine

## 2021-04-06 ENCOUNTER — Other Ambulatory Visit: Payer: Self-pay

## 2021-04-06 ENCOUNTER — Encounter: Payer: Self-pay | Admitting: Family Medicine

## 2021-04-06 VITALS — BP 119/80 | HR 64 | Temp 98.6°F | Wt 163.0 lb

## 2021-04-06 DIAGNOSIS — F17298 Nicotine dependence, other tobacco product, with other nicotine-induced disorders: Secondary | ICD-10-CM | POA: Diagnosis not present

## 2021-04-06 DIAGNOSIS — Z0000 Encounter for general adult medical examination without abnormal findings: Secondary | ICD-10-CM

## 2021-04-06 DIAGNOSIS — F410 Panic disorder [episodic paroxysmal anxiety] without agoraphobia: Secondary | ICD-10-CM | POA: Diagnosis not present

## 2021-04-06 DIAGNOSIS — F411 Generalized anxiety disorder: Secondary | ICD-10-CM

## 2021-04-06 DIAGNOSIS — F17200 Nicotine dependence, unspecified, uncomplicated: Secondary | ICD-10-CM | POA: Insufficient documentation

## 2021-04-06 MED ORDER — LORAZEPAM 0.5 MG PO TABS: 0.5000 mg | ORAL_TABLET | Freq: Every day | ORAL | 1 refills | Status: DC

## 2021-04-06 NOTE — Assessment & Plan Note (Signed)
Needs vision Needs dental Encouraged to call and make appts Things to do to keep yourself healthy  - Exercise at least 30-45 minutes a day, 3-4 days a week.  - Eat a low-fat diet with lots of fruits and vegetables, up to 7-9 servings per day.  - Seatbelts can save your life. Wear them always.  - Smoke detectors on every level of your home, check batteries every year.  - Eye Doctor - have an eye exam every 1-2 years  - Safe sex - if you may be exposed to STDs, use a condom.  - Alcohol -  If you drink, do it moderately, less than 2 drinks per day.  - Health Care Power of Attorney. Choose someone to speak for you if you are not able.  - Depression is common in our stressful world.If you're feeling down or losing interest in things you normally enjoy, please come in for a visit.  - Violence - If anyone is threatening or hurting you, please call immediately.

## 2021-04-06 NOTE — Assessment & Plan Note (Signed)
Vaping; hx of cigarettes prior Referral to smoking cessation to assist Pt wishes to quit- know that it will be difficult

## 2021-04-06 NOTE — Assessment & Plan Note (Signed)
Chronic, well controlled Continue medication to assist Continue PRN medication as PRN Contracted to safety- denies SI or HI

## 2021-04-06 NOTE — Progress Notes (Signed)
Vivien Rota DeSanto,acting as a scribe for [name], FNP.,have documented all relevant documentation on the behalf of [name], FNP,as directed by  [name], FNP while in the presence of [name], FNP.    Complete physical exam   Patient: Todd Fleming   DOB: 1986-01-13   36 y.o. Male  MRN: [id number] Visit Date: 04/06/2021  Today's healthcare provider: [name], FNP   No chief complaint on file.  Subjective    ALBI RAPPAPORT is a 36 y.o. male who presents today for a complete physical exam.  He reports consuming a general diet. The patient has a physically strenuous job, but has no regular exercise apart from work.  He generally feels well. He reports sleeping well. He does have additional problems to discuss today.  HPI  Patient is also for follow up of depression and anxiety.  He was last seen 4 months ago and at that time he was instructed to resume Lexapro 20 mg instead of 15 and his Ativan was decreased from 30 pills to 20 pills per month.  Patient reports he is doing well on this regiment.  No past medical history on file. Past Surgical History:  Procedure Laterality Date   HERNIA REPAIR     Social History   Socioeconomic History   Marital status: Single    Spouse name: Not on file   Number of children: Not on file   Years of education: Not on file   Highest education level: Not on file  Occupational History   Not on file  Tobacco Use   Smoking status: Every Day    Types: E-cigarettes   Smokeless tobacco: Current  Substance and Sexual Activity   Alcohol use: Yes   Drug use: Not on file   Sexual activity: Not on file  Other Topics Concern   Not on file  Social History Narrative   Not on file   Social Determinants of Health   Financial Resource Strain: Not on file  Food Insecurity: Not on file  Transportation Needs: Not on file  Physical Activity: Not on file  Stress: Not on file  Social Connections: Not on file  Intimate  Partner Violence: Not on file   No family status information on file.   No family history on file. No Known Allergies  Patient Care Team: [name], FNP as PCP - General (Family Medicine)   Medications: Outpatient Medications Prior to Visit  Medication Sig   escitalopram (LEXAPRO) 20 MG tablet TAKE 1 TABLET BY MOUTH EVERY DAY   LORazepam (ATIVAN) 0.5 MG tablet Take 0.5 mg by mouth every 8 (eight) hours.   omeprazole (PRILOSEC) 40 MG capsule Take 1 capsule (40 mg total) by mouth daily.   No facility-administered medications prior to visit.    Review of Systems  Constitutional: Negative.   HENT: Negative.    Eyes: Negative.   Respiratory: Negative.    Cardiovascular: Negative.   Gastrointestinal: Negative.   Endocrine: Negative.   Genitourinary: Negative.   Musculoskeletal: Negative.   Skin: Negative.   Allergic/Immunologic: Positive for environmental allergies.  Neurological: Negative.   Hematological: Negative.   Psychiatric/Behavioral:  Positive for agitation. The patient is nervous/anxious.      Objective    BP 133/90 (BP Location: Left Arm, Patient Position: Sitting, Cuff Size: Normal)    Pulse 64    Temp 98.6 F (37 C) (Oral)    Wt 163 lb (73.9 kg)  SpO2 95%    BMI 24.42 kg/m  Vitals:   04/06/21 1339  BP: 133/90  Pulse: 64  Temp: 98.6 F (37 C)  TempSrc: Oral  SpO2: 95%  Weight: 163 lb (73.9 kg)     Physical Exam Vitals and nursing note reviewed.  Constitutional:      General: He is awake. He is not in acute distress.    Appearance: Normal appearance. He is well-developed, well-groomed and normal weight. He is not ill-appearing, toxic-appearing or diaphoretic.  HENT:     Head: Normocephalic and atraumatic.     Jaw: There is normal jaw occlusion. No trismus, tenderness, swelling or pain on movement.     Salivary Glands: Right salivary gland is not diffusely enlarged or tender. Left salivary gland is not diffusely enlarged or tender.     Right  Ear: Hearing, tympanic membrane, ear canal and external ear normal. There is no impacted cerumen.     Left Ear: Hearing, tympanic membrane, ear canal and external ear normal. There is no impacted cerumen.     Nose: Nose normal. No congestion or rhinorrhea.     Right Turbinates: Not enlarged, swollen or pale.     Left Turbinates: Not enlarged, swollen or pale.     Right Sinus: No maxillary sinus tenderness or frontal sinus tenderness.     Left Sinus: No maxillary sinus tenderness or frontal sinus tenderness.     Mouth/Throat:     Lips: Pink.     Mouth: Mucous membranes are moist. No injury, lacerations, oral lesions or angioedema.     Pharynx: Oropharynx is clear. Uvula midline. No pharyngeal swelling, oropharyngeal exudate or posterior oropharyngeal erythema.     Tonsils: No tonsillar exudate or tonsillar abscesses.  Eyes:     General: Lids are normal. Vision grossly intact. Gaze aligned appropriately.        Right eye: No discharge.        Left eye: No discharge.     Extraocular Movements: Extraocular movements intact.     Conjunctiva/sclera: Conjunctivae normal.     Pupils: Pupils are equal, round, and reactive to light.  Neck:     Thyroid: No thyroid mass, thyromegaly or thyroid tenderness.     Vascular: No carotid bruit.     Trachea: Trachea normal. No tracheal tenderness.  Cardiovascular:     Rate and Rhythm: Normal rate and regular rhythm.     Pulses: Normal pulses.          Carotid pulses are 2+ on the right side and 2+ on the left side.      Radial pulses are 2+ on the right side and 2+ on the left side.       Femoral pulses are 2+ on the right side and 2+ on the left side.      Popliteal pulses are 2+ on the right side and 2+ on the left side.       Dorsalis pedis pulses are 2+ on the right side and 2+ on the left side.       Posterior tibial pulses are 2+ on the right side and 2+ on the left side.     Heart sounds: Normal heart sounds, S1 normal and S2 normal. No murmur  heard.   No friction rub. No gallop.  Pulmonary:     Effort: Pulmonary effort is normal. No respiratory distress.     Breath sounds: Normal breath sounds and air entry. No stridor. No wheezing, rhonchi or rales.  Chest:  Chest wall: No tenderness.  Abdominal:     General: Abdomen is flat. Bowel sounds are normal. There is no distension.     Palpations: Abdomen is soft. There is no mass.     Tenderness: There is no abdominal tenderness. There is no guarding or rebound.     Hernia: No hernia is present.  Genitourinary:    Comments: Exam deferred; denies complaints Musculoskeletal:        General: No swelling, tenderness, deformity or signs of injury. Normal range of motion.     Cervical back: Normal range of motion and neck supple. No rigidity or tenderness.     Right lower leg: No edema.     Left lower leg: No edema.  Lymphadenopathy:     Cervical: No cervical adenopathy.     Right cervical: No superficial, deep or posterior cervical adenopathy.    Left cervical: No superficial, deep or posterior cervical adenopathy.  Skin:    General: Skin is warm and dry.     Capillary Refill: Capillary refill takes less than 2 seconds.     Coloration: Skin is not jaundiced or pale.     Findings: No bruising, erythema, lesion or rash.  Neurological:     General: No focal deficit present.     Mental Status: He is alert and oriented to person, place, and time. Mental status is at baseline.     GCS: GCS eye subscore is 4. GCS verbal subscore is 5. GCS motor subscore is 6.     Sensory: Sensation is intact. No sensory deficit.     Motor: Motor function is intact. No weakness.     Coordination: Coordination is intact.     Gait: Gait is intact.  Psychiatric:        Attention and Perception: Attention and perception normal.        Mood and Affect: Mood and affect normal.        Speech: Speech normal.        Behavior: Behavior normal. Behavior is cooperative.        Thought Content: Thought  content normal.        Cognition and Memory: Cognition normal.        Judgment: Judgment normal.      Last depression screening scores PHQ 2/9 Scores 04/06/2021 02/22/2020  PHQ - 2 Score 1 3  PHQ- 9 Score 3 10   Last fall risk screening Fall Risk  04/06/2021  Falls in the past year? 0   Last Audit-C alcohol use screening Alcohol Use Disorder Test (AUDIT) 04/06/2021  1. How often do you have a drink containing alcohol? 4  2. How many drinks containing alcohol do you have on a typical day when you are drinking? 1  3. How often do you have six or more drinks on one occasion? 3  AUDIT-C Score 8   A score of 3 or more in women, and 4 or more in men indicates increased risk for alcohol abuse, EXCEPT if all of the points are from question 1   No results found for any visits on 04/06/21.  Assessment & Plan    Routine Health Maintenance and Physical Exam  Exercise Activities and Dietary recommendations  Goals   None      There is no immunization history on file for this patient.  Health Maintenance  Topic Date Due   COVID-19 Vaccine (1) 04/15/2021 (Originally 08/[birth year]/1984)   TETANUS/TDAP  04/26/2021 (Originally 02/23/2003)   INFLUENZA VACCINE  05/15/2021 (  Originally 09/15/2020)   Hepatitis C Screening  Completed   HIV Screening  Completed   HPV VACCINES  Aged Out    Discussed health benefits of physical activity, and encouraged him to engage in regular exercise appropriate for his age and condition.  Problem List Items Addressed This Visit       Other   Panic attacks    Pt report less panic attacks since starting SSRI Doing well with quantity reduction PDMP reviewed Pt aware of risks with chronic benzo use      Relevant Medications   LORazepam (ATIVAN) 0.5 MG tablet   GAD (generalized anxiety disorder)    Chronic, well controlled Continue medication to assist Continue PRN medication as PRN Contracted to safety- denies SI or HI      Relevant Medications   LORazepam  (ATIVAN) 0.5 MG tablet   Nicotine dependence    Vaping; hx of cigarettes prior Referral to smoking cessation to assist Pt wishes to quit- know that it will be difficult       Relevant Orders   Ambulatory referral to Smoking Cessation Program   Annual physical exam - Primary    Needs vision Needs dental Encouraged to call and make appts Things to do to keep yourself healthy  - Exercise at least 30-45 minutes a day, 3-4 days a week.  - Eat a low-fat diet with lots of fruits and vegetables, up to 7-9 servings per day.  - Seatbelts can save your life. Wear them always.  - Smoke detectors on every level of your home, check batteries every year.  - Eye Doctor - have an eye exam every 1-2 years  - Safe sex - if you may be exposed to STDs, use a condom.  - Alcohol -  If you drink, do it moderately, less than 2 drinks per day.  - Health Care Power of Attorney. Choose someone to speak for you if you are not able.  - Depression is common in our stressful world.If you're feeling down or losing interest in things you normally enjoy, please come in for a visit.  - Violence - If anyone is threatening or hurting you, please call immediately.        Relevant Orders   Comprehensive metabolic panel     No follow-ups on file.     Leilani Merl, FNP, have reviewed all documentation for this visit. The documentation on 04/06/21 for the exam, diagnosis, procedures, and orders are all accurate and complete.    [name], FNP  [facility] [phone] (phone) [phone] (fax)  [facility]

## 2021-04-06 NOTE — Assessment & Plan Note (Signed)
Pt report less panic attacks since starting SSRI Doing well with quantity reduction PDMP reviewed Pt aware of risks with chronic benzo use

## 2021-04-07 LAB — COMPREHENSIVE METABOLIC PANEL
ALT: 24 IU/L (ref 0–44)
AST: 17 IU/L (ref 0–40)
Albumin/Globulin Ratio: 2.2 (ref 1.2–2.2)
Albumin: 5 g/dL (ref 4.0–5.0)
Alkaline Phosphatase: 123 IU/L — ABNORMAL HIGH (ref 44–121)
BUN/Creatinine Ratio: 14 (ref 9–20)
BUN: 15 mg/dL (ref 6–20)
Bilirubin Total: 0.5 mg/dL (ref 0.0–1.2)
CO2: 24 mmol/L (ref 20–29)
Calcium: 9.8 mg/dL (ref 8.7–10.2)
Chloride: 103 mmol/L (ref 96–106)
Creatinine, Ser: 1.04 mg/dL (ref 0.76–1.27)
Globulin, Total: 2.3 g/dL (ref 1.5–4.5)
Glucose: 100 mg/dL — ABNORMAL HIGH (ref 70–99)
Potassium: 4.7 mmol/L (ref 3.5–5.2)
Sodium: 141 mmol/L (ref 134–144)
Total Protein: 7.3 g/dL (ref 6.0–8.5)
eGFR: 95 mL/min/{1.73_m2} (ref >59)

## 2021-09-23 ENCOUNTER — Encounter: Payer: Self-pay | Admitting: Family Medicine

## 2021-09-23 DIAGNOSIS — K219 Gastro-esophageal reflux disease without esophagitis: Secondary | ICD-10-CM

## 2021-09-23 MED ORDER — OMEPRAZOLE 40 MG PO CPDR: 40.0000 mg | DELAYED_RELEASE_CAPSULE | Freq: Every day | ORAL | 1 refills | Status: DC

## 2021-09-23 MED ORDER — ESCITALOPRAM OXALATE 20 MG PO TABS: 20.0000 mg | ORAL_TABLET | Freq: Every day | ORAL | 1 refills | Status: DC

## 2021-09-30 NOTE — Progress Notes (Deleted)
      Established patient visit   Patient: Todd Fleming   DOB: 1985/08/05   36 y.o. Male  MRN: [id number] Visit Date: 10/05/2021  Today's healthcare provider: [name], FNP   No chief complaint on file.  Subjective    HPI  Anxiety, Follow-up  He was last seen for anxiety 6 months ago. Changes made at last visit include start ativan .5mg .   He reports {excellent/good/fair/poor:19665} compliance with treatment. He reports {good/fair/poor:18685} tolerance of treatment. He {is/is not:21021397} having side effects. {document side effects if present:1}  He feels his anxiety is {Desc; severity:60313} and {improved/worse/unchanged:3041574} since last visit.  Symptoms: {Yes/No:20286} chest pain {Yes/No:20286} difficulty concentrating  {Yes/No:20286} dizziness {Yes/No:20286} fatigue  {Yes/No:20286} feelings of losing control {Yes/No:20286} insomnia  {Yes/No:20286} irritable {Yes/No:20286} palpitations  {Yes/No:20286} panic attacks {Yes/No:20286} racing thoughts  {Yes/No:20286} shortness of breath {Yes/No:20286} sweating  {Yes/No:20286} tremors/shakes    GAD-7 Results     No data to display          PHQ-9 Scores    04/06/2021    1:38 PM 02/22/2020   11:07 AM  PHQ9 SCORE ONLY  PHQ-9 Total Score 3 10    ---------------------------------------------------------------------------------------------------   Medications: Outpatient Medications Prior to Visit  Medication Sig   escitalopram (LEXAPRO) 20 MG tablet Take 1 tablet (20 mg total) by mouth daily.   LORazepam (ATIVAN) 0.5 MG tablet Take 1 tablet (0.5 mg total) by mouth at bedtime. Will cause drowsiness.   omeprazole (PRILOSEC) 40 MG capsule Take 1 capsule (40 mg total) by mouth daily.   No facility-administered medications prior to visit.    Review of Systems  {Labs  Heme  Chem  Endocrine  Serology  Results Review (optional):23779}   Objective    There were no vitals taken for this visit. {Show  previous vital signs (optional):23777}  Physical Exam  ***  No results found for any visits on 10/05/21.  Assessment & Plan     ***  No follow-ups on file.      {provider attestation***:1}   10/07/21, FNP  [facility] [phone] (phone) [phone] (fax)  [facility]

## 2021-10-01 ENCOUNTER — Telehealth: Payer: Self-pay

## 2021-10-01 NOTE — Telephone Encounter (Signed)
Copied from CRM [id number]. Topic: General - Other >> Oct 01, 2021  9:50 AM Clide Dales wrote: Patient would like recommendations on how to get discounts on medication refills while he is without insurance. Please advise.

## 2021-10-01 NOTE — Telephone Encounter (Signed)
Spoke with patient, he stated he already picked up the script and a discount was used.

## 2021-10-05 ENCOUNTER — Ambulatory Visit: Payer: BC Managed Care – PPO | Admitting: Family Medicine

## 2021-11-11 NOTE — Progress Notes (Signed)
I,[name],acting as a Neurosurgeon for Todd Kindle, FNP.,have documented all relevant documentation on the behalf of Todd Kindle, FNP,as directed by  Todd Kindle, FNP while in the presence of Todd Kindle, FNP.  Established patient visit  Patient: Todd Fleming   DOB: November 02, 1985   36 y.o. Male  MRN: [id number] Visit Date: 11/13/2021  Today's healthcare provider: [name], FNP  Re Introduced to nurse practitioner role and practice setting.  All questions answered.  Discussed provider/patient relationship and expectations.  Subjective    HPI  Patient reports feeling more down and anxious since loss of job in July. Notes that he is still drinking alcohol to excess, however, not getting drunk and continues to use vaping products as well. Reports social isolation, family members all drink and patient has split custody of his 97 year old daughter.   Anxiety, Follow-up  He was last seen for anxiety 6 months ago. Changes made at last visit include continue LORazepam (ATIVAN) 0.5 MG tablet. And lexapro 20 mg. Patient has discontinued use of Ativan since July and does not wish to re-start.   He reports excellent compliance with treatment. He reports excellent tolerance of treatment. He is not having side effects.   He feels his anxiety is in between moderate and severe and Worse since last visit.  Symptoms: No chest pain No difficulty concentrating  No dizziness No fatigue  Yes feelings of losing control Yes insomnia  Yes irritable No palpitations  Yes panic attacks Yes racing thoughts  No shortness of breath Yes sweating  No tremors/shakes    GAD-7 Results    11/13/2021   10:57 AM  GAD-7 Generalized Anxiety Disorder Screening Tool  1. Feeling Nervous, Anxious, or on Edge 3  2. Not Being Able to Stop or Control Worrying 1  3. Worrying Too Much About Different Things 3  4. Trouble Relaxing 3  5. Being So Restless it's Hard To Sit Still 1  6. Becoming Easily  Annoyed or Irritable 3  7. Feeling Afraid As If Something Awful Might Happen 0  Total GAD-7 Score 14  Difficulty At Work, Home, or Getting  Along With Others? Somewhat difficult    PHQ-9 Scores    11/13/2021   10:32 AM 04/06/2021    1:38 PM 02/22/2020   11:07 AM  PHQ9 SCORE ONLY  PHQ-9 Total Score 6 3 10     ---------------------------------------------------------------------------------------------------   Medications: Outpatient Medications Prior to Visit  Medication Sig   [DISCONTINUED] escitalopram (LEXAPRO) 20 MG tablet Take 1 tablet (20 mg total) by mouth daily.   [DISCONTINUED] omeprazole (PRILOSEC) 40 MG capsule Take 1 capsule (40 mg total) by mouth daily.   [DISCONTINUED] LORazepam (ATIVAN) 0.5 MG tablet Take 1 tablet (0.5 mg total) by mouth at bedtime. Will cause drowsiness. (Patient not taking: Reported on 11/13/2021)   No facility-administered medications prior to visit.    Review of Systems    Objective    BP 114/66 (BP Location: Left Arm, Patient Position: Sitting, Cuff Size: Normal)   Pulse 70   Ht 5\' 9"  (1.753 m)   Wt 162 lb 14.4 oz (73.9 kg)   SpO2 98%   BMI 24.06 kg/m   Physical Exam Vitals and nursing note reviewed.  Constitutional:      Appearance: Normal appearance. He is normal weight.  HENT:     Head: Normocephalic and atraumatic.  Eyes:     Pupils: Pupils are equal, round, and reactive to light.  Cardiovascular:     Rate and Rhythm: Normal rate and regular rhythm.     Pulses: Normal pulses.     Heart sounds: Normal heart sounds.  Pulmonary:     Effort: Pulmonary effort is normal.     Breath sounds: Normal breath sounds.  Musculoskeletal:        General: Normal range of motion.     Cervical back: Normal range of motion.  Skin:    General: Skin is warm and dry.     Capillary Refill: Capillary refill takes less than 2 seconds.  Neurological:     General: No focal deficit present.     Mental Status: He is alert and oriented to person,  place, and time. Mental status is at baseline.  Psychiatric:        Mood and Affect: Mood normal.        Behavior: Behavior normal.        Thought Content: Thought content normal.        Judgment: Judgment normal.     No results found for any visits on 11/13/21.  Assessment & Plan     Problem List Items Addressed This Visit       Digestive   Gastroesophageal reflux disease with esophagitis without hemorrhage    Chronic, stable Wishes to continue PPI      Relevant Medications   omeprazole (PRILOSEC) 40 MG capsule     Other   Alcohol use disorder, moderate, dependence (HCC)    Chronic, worsening d/t "feeling sad" Discussed start of Naltrexone (oral) to assist; patient does not wish to start medication at this time Was in rehab in Summer 2023; resulting in loss of employment following discharge Patient returned to drinking the day he lost his job Declines getting "drunk" due to the 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      Vonna Kotyk, FNP, have reviewed all documentation for this visit. The documentation on 11/13/21 for the exam, diagnosis, procedures, and orders are all accurate and complete.  Gwyneth Sprout, [place] [phone] (phone) [phone] (fax)  [place]

## 2021-11-13 ENCOUNTER — Ambulatory Visit (INDEPENDENT_AMBULATORY_CARE_PROVIDER_SITE_OTHER): Payer: Self-pay | Admitting: Family Medicine

## 2021-11-13 ENCOUNTER — Encounter: Payer: Self-pay | Admitting: Family Medicine

## 2021-11-13 VITALS — BP 114/66 | HR 70 | Ht 69.0 in | Wt 162.9 lb

## 2021-11-13 DIAGNOSIS — K2100 Gastro-esophageal reflux disease with esophagitis, without bleeding: Secondary | ICD-10-CM | POA: Insufficient documentation

## 2021-11-13 DIAGNOSIS — F17298 Nicotine dependence, other tobacco product, with other nicotine-induced disorders: Secondary | ICD-10-CM

## 2021-11-13 DIAGNOSIS — F411 Generalized anxiety disorder: Secondary | ICD-10-CM

## 2021-11-13 DIAGNOSIS — F1020 Alcohol dependence, uncomplicated: Secondary | ICD-10-CM | POA: Insufficient documentation

## 2021-11-13 MED ORDER — OMEPRAZOLE 40 MG PO CPDR: 40.0000 mg | DELAYED_RELEASE_CAPSULE | Freq: Every day | ORAL | 1 refills | Status: DC

## 2021-11-13 MED ORDER — ESCITALOPRAM OXALATE 20 MG PO TABS: 40.0000 mg | ORAL_TABLET | Freq: Every day | ORAL | 1 refills | Status: DC

## 2021-11-13 NOTE — Assessment & Plan Note (Signed)
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

## 2021-11-13 NOTE — Assessment & Plan Note (Signed)
Chronic, stable Wishes to continue PPI

## 2021-11-13 NOTE — Assessment & Plan Note (Signed)
Chronic, worse per pt report Recommend titration of lexapro to 40 mg with 6 week f/u Associated with job change following rehab for ETOH abuse from July to mid-Sept Previous on benzo, ativan; patient does not wish to restart

## 2021-11-13 NOTE — Assessment & Plan Note (Signed)
Chronic, stable Encouraged to stop; however, patient feels that ETOH use is his priority at this time

## 2021-12-28 NOTE — Progress Notes (Signed)
I,April Miller,acting as a scribe for [name], FNP.,have documented all relevant documentation on the behalf of [name], FNP,as directed by  [name], FNP while in the presence of [name], FNP.   Established patient visit   Patient: Todd Fleming   DOB: [birth year]   36 y.o. Male  MRN: [id number] Visit Date: 01/01/2022  Today's healthcare provider: [name], FNP  Re Introduced to nurse practitioner role and practice setting.  All questions answered.  Discussed provider/patient relationship and expectations.  Chief Complaint  Patient presents with   Follow-up   Anxiety   Subjective    HPI  Anxiety, Follow-up  He was last seen for anxiety 6 weeks ago. Changes made at last visit include titrate lexapro to 40mg .  Patient states he cold not take 40 mg lexapro. It was to strong.  GAD-7 Results    01/01/2022    3:47 PM 11/13/2021   10:57 AM  GAD-7 Generalized Anxiety Disorder Screening Tool  1. Feeling Nervous, Anxious, or on Edge 2 3  2. Not Being Able to Stop or Control Worrying 1 1  3. Worrying Too Much About Different Things 2 3  4. Trouble Relaxing 2 3  5. Being So Restless it's Hard To Sit Still 1 1  6. Becoming Easily Annoyed or Irritable 3 3  7. Feeling Afraid As If Something Awful Might Happen 0 0  Total GAD-7 Score 11 14  Difficulty At Work, Home, or Getting  Along With Others? Somewhat difficult Somewhat difficult    PHQ-9 Scores    01/01/2022    3:44 PM 11/13/2021   10:32 AM 04/06/2021    1:38 PM  PHQ9 SCORE ONLY  PHQ-9 Total Score 7 6 3     ---------------------------------------------------------------------------------------------------   Medications: Outpatient Medications Prior to Visit  Medication Sig   omeprazole (PRILOSEC) 40 MG capsule Take 1 capsule (40 mg total) by mouth daily.   [DISCONTINUED] escitalopram (LEXAPRO) 20 MG tablet Take 2 tablets (40 mg total) by mouth daily.   No facility-administered  medications prior to visit.    Review of Systems    Objective    BP 130/80 (BP Location: Left Arm, Patient Position: Sitting, Cuff Size: Large)   Pulse (!) 58   Resp 14   Wt 174 lb (78.9 kg)   SpO2 98%   BMI 25.70 kg/m   Physical Exam Vitals and nursing note reviewed.  Constitutional:      Appearance: Normal appearance.  HENT:     Head: Normocephalic and atraumatic.  Eyes:     Pupils: Pupils are equal, round, and reactive to light.  Cardiovascular:     Rate and Rhythm: Normal rate and regular rhythm.     Pulses: Normal pulses.     Heart sounds: Normal heart sounds.  Pulmonary:     Effort: Pulmonary effort is normal.     Breath sounds: Normal breath sounds.  Musculoskeletal:        General: Normal range of motion.     Cervical back: Normal range of motion.  Skin:    General: Skin is warm and dry.     Capillary Refill: Capillary refill takes less than 2 seconds.  Neurological:     General: No focal deficit present.     Mental Status: He is alert and oriented to person, place, and time. Mental status is at baseline.  Psychiatric:        Mood and Affect: Mood is depressed.  Affect is blunt and flat.        Behavior: Behavior normal.        Thought Content: Thought content normal. Thought content is not paranoid or delusional. Thought content does not include homicidal or suicidal ideation. Thought content does not include homicidal or suicidal plan.        Judgment: Judgment normal.    No results found for any visits on 01/01/22.  Assessment & Plan     Problem List Items Addressed This Visit       Digestive   Gastroesophageal reflux disease with esophagitis without hemorrhage - Primary    Chronic, stable Continue Prilosec 40 mg Reports ongoing symptoms for 20+ years         Other   Alcohol use disorder, moderate, dependence (HCC)    Chronic, recurrent  Has previously tried rehab however, restarted following acute job status change      GAD (generalized  anxiety disorder)    Chronic, waxes/wanes Denies SI or HI; keeps going as he has debts to pay and his daughter Is not seeing anyone now Continues to work; has gotten a Production assistant, 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     Leilani Merl, FNP, have reviewed all documentation for this visit. The documentation on 01/01/22 for the exam, diagnosis, procedures, and orders are all accurate and complete.  [name], FNP  [facility] [phone] (phone) [phone] (fax)  [facility]

## 2022-01-01 ENCOUNTER — Encounter: Payer: Self-pay | Admitting: Family Medicine

## 2022-01-01 ENCOUNTER — Ambulatory Visit: Payer: BC Managed Care – PPO | Admitting: Family Medicine

## 2022-01-01 VITALS — BP 130/80 | HR 58 | Resp 14 | Wt 174.0 lb

## 2022-01-01 DIAGNOSIS — F1020 Alcohol dependence, uncomplicated: Secondary | ICD-10-CM

## 2022-01-01 DIAGNOSIS — F17298 Nicotine dependence, other tobacco product, with other nicotine-induced disorders: Secondary | ICD-10-CM

## 2022-01-01 DIAGNOSIS — K2100 Gastro-esophageal reflux disease with esophagitis, without bleeding: Secondary | ICD-10-CM | POA: Diagnosis not present

## 2022-01-01 DIAGNOSIS — F411 Generalized anxiety disorder: Secondary | ICD-10-CM

## 2022-01-01 MED ORDER — ESCITALOPRAM OXALATE 20 MG PO TABS: 30.0000 mg | ORAL_TABLET | Freq: Every day | ORAL | 0 refills | Status: DC

## 2022-01-01 NOTE — Assessment & Plan Note (Signed)
Chronic, stable Continue Prilosec 40 mg Reports ongoing symptoms for 20+ years

## 2022-01-01 NOTE — Assessment & Plan Note (Signed)
Chronic, stable Remains pre contemplative about stopping use at this time

## 2022-01-01 NOTE — Assessment & Plan Note (Signed)
Chronic, waxes/wanes Denies SI or HI; keeps going as he has debts to pay and his daughter Is not seeing anyone now Continues to work; has gotten a promotion which is reassuring given previous job loss Will try 30 mg lexapro at bedtime to assist; failed 40 mg PO daily given complaints of fatigue

## 2022-01-01 NOTE — Assessment & Plan Note (Signed)
Chronic, recurrent  Has previously tried rehab however, restarted following acute job status change

## 2022-02-19 ENCOUNTER — Ambulatory Visit: Payer: BC Managed Care – PPO | Admitting: Family Medicine

## 2022-03-05 ENCOUNTER — Encounter: Payer: Self-pay | Admitting: Family Medicine

## 2022-03-05 ENCOUNTER — Ambulatory Visit: Payer: BC Managed Care – PPO | Admitting: Family Medicine

## 2022-03-05 VITALS — BP 128/78 | HR 65 | Temp 98.4°F | Wt 176.3 lb

## 2022-03-05 DIAGNOSIS — F5104 Psychophysiologic insomnia: Secondary | ICD-10-CM | POA: Diagnosis not present

## 2022-03-05 DIAGNOSIS — F1020 Alcohol dependence, uncomplicated: Secondary | ICD-10-CM

## 2022-03-05 DIAGNOSIS — K2100 Gastro-esophageal reflux disease with esophagitis, without bleeding: Secondary | ICD-10-CM | POA: Diagnosis not present

## 2022-03-05 DIAGNOSIS — F411 Generalized anxiety disorder: Secondary | ICD-10-CM | POA: Diagnosis not present

## 2022-03-05 MED ORDER — OMEPRAZOLE 40 MG PO CPDR: 40.0000 mg | DELAYED_RELEASE_CAPSULE | Freq: Every day | ORAL | 3 refills | Status: DC

## 2022-03-05 MED ORDER — ESCITALOPRAM OXALATE 20 MG PO TABS: 40.0000 mg | ORAL_TABLET | Freq: Every day | ORAL | 3 refills | Status: DC

## 2022-03-05 MED ORDER — TRAZODONE HCL 100 MG PO TABS: 100.0000 mg | ORAL_TABLET | Freq: Every day | ORAL | 5 refills | Status: DC

## 2022-03-05 NOTE — Assessment & Plan Note (Signed)
Chronic, stable Continue prilosec 40 mg Discussed nicotine cessation and alcohol reduction to assist

## 2022-03-05 NOTE — Progress Notes (Signed)
I,Connie R Striblin,acting as a Education administrator for Gwyneth Sprout, FNP.,have documented all relevant documentation on the behalf of Gwyneth Sprout, FNP,as directed by  Gwyneth Sprout, FNP while in the presence of Gwyneth Sprout, FNP.   Established patient visit   Patient: Todd Fleming   DOB: March 11, 1985   37 y.o. Male  MRN: [id number] Visit Date: 03/05/2022  Today's healthcare provider: Gwyneth Sprout, FNP  Re Introduced to nurse practitioner role and practice setting.  All questions answered.  Discussed provider/patient relationship and expectations.   No chief complaint on file.  Subjective    HPI  Anxiety/depression  Follow-up  He was last seen for anxiety 6 weeks ago. Changes made at last visit include 30 mg lexapro at bedtime .   He reports excellent compliance with treatment. He reports good tolerance of treatment. He is not having side effects.   He feels his anxiety is moderate and Unchanged since last visit.      Pt has been taking 40mg  of lexapro instead of 30mg  to prevent breaking pills everyday.  Symptoms: No chest pain No difficulty concentrating  No dizziness No fatigue  Yes feelings of losing control No insomnia  No irritable No palpitations  Yes panic attacks Yes racing thoughts  No shortness of breath No sweating  No tremors/shakes    GAD-7 Results    03/05/2022    4:49 PM 01/01/2022    3:47 PM 11/13/2021   10:57 AM  GAD-7 Generalized Anxiety Disorder Screening Tool  1. Feeling Nervous, Anxious, or on Edge 2 2 3   2. Not Being Able to Stop or Control Worrying 2 1 1   3. Worrying Too Much About Different Things 2 2 3   4. Trouble Relaxing 2 2 3   5. Being So Restless it's Hard To Sit Still 0 1 1  6. Becoming Easily Annoyed or Irritable 2 3 3   7. Feeling Afraid As If Something Awful Might Happen 0 0 0  Total GAD-7 Score 10 11 14   Difficulty At Work, Home, or Getting  Along With Others? Somewhat difficult Somewhat difficult Somewhat difficult    PHQ-9  Scores    03/05/2022    4:09 PM 01/01/2022    3:44 PM 11/13/2021   10:32 AM  PHQ9 SCORE ONLY  PHQ-9 Total Score 6 7 6     ---------------------------------------------------------------------------------------------------   Medications: Outpatient Medications Prior to Visit  Medication Sig   [DISCONTINUED] escitalopram (LEXAPRO) 20 MG tablet Take 1.5 tablets (30 mg total) by mouth daily.   [DISCONTINUED] omeprazole (PRILOSEC) 40 MG capsule Take 1 capsule (40 mg total) by mouth daily.   No facility-administered medications prior to visit.    Review of Systems    Objective    BP 128/78 (BP Location: Right Arm, Patient Position: Sitting, Cuff Size: Normal)   Pulse 65   Temp 98.4 F (36.9 C) (Oral)   Wt 176 lb 4.8 oz (80 kg)   SpO2 100%   BMI 26.03 kg/m   Physical Exam Vitals and nursing note reviewed.  Constitutional:      Appearance: Normal appearance. He is overweight.  HENT:     Head: Normocephalic and atraumatic.  Eyes:     Pupils: Pupils are equal, round, and reactive to light.  Cardiovascular:     Rate and Rhythm: Normal rate and regular rhythm.     Pulses: Normal pulses.     Heart sounds: Normal heart sounds.  Pulmonary:     Effort: Pulmonary effort  is normal.     Breath sounds: Normal breath sounds.  Musculoskeletal:        General: Normal range of motion.     Cervical back: Normal range of motion.  Skin:    General: Skin is warm and dry.     Capillary Refill: Capillary refill takes less than 2 seconds.  Neurological:     General: No focal deficit present.     Mental Status: He is alert and oriented to person, place, and time. Mental status is at baseline.  Psychiatric:        Attention and Perception: Attention normal.        Mood and Affect: Mood is depressed. Affect is blunt and flat.        Speech: Speech normal.        Behavior: Behavior normal. Behavior is cooperative.        Thought Content: Thought content normal.        Cognition and Memory:  Cognition and memory normal.        Judgment: Judgment normal.     No results found for any visits on 03/05/22.  Assessment & Plan     Problem List Items Addressed This Visit       Digestive   Gastroesophageal reflux disease with esophagitis without hemorrhage    Chronic, stable Continue prilosec 40 mg Discussed nicotine cessation and alcohol reduction to assist      Relevant Medications   omeprazole (PRILOSEC) 40 MG capsule     Other   Alcohol use disorder, moderate, dependence (HCC)    Chronic, stable Notes he begins drinking when he feels anxious or paniced Typically drinks 6 beers/night or 3 whiskeys Encouraged to cut back to 2 drinks/day slowly to prevent chronic conditions including cirrhosis      GAD (generalized anxiety disorder) - Primary    Chronic, slight improvement Recommend focus on the glimmers in life and work on perception modification Continue lexapro at 40 mg per pt preference      Relevant Medications   traZODone (DESYREL) 100 MG tablet   escitalopram (LEXAPRO) 20 MG tablet   Psychophysiological insomnia    Chronic, previously treated with benzo  Recommend trial of trazodone to assist      Relevant Medications   traZODone (DESYREL) 100 MG tablet   Return if symptoms worsen or fail to improve.     Vonna Kotyk, FNP, have reviewed all documentation for this visit. The documentation on 03/05/22 for the exam, diagnosis, procedures, and orders are all accurate and complete.  Gwyneth Sprout, [place] [phone] (phone) [phone] (fax)  [place]

## 2022-03-05 NOTE — Assessment & Plan Note (Signed)
Chronic, previously treated with benzo  Recommend trial of trazodone to assist

## 2022-03-05 NOTE — Assessment & Plan Note (Signed)
Chronic, slight improvement Recommend focus on the glimmers in life and work on perception modification Continue lexapro at 40 mg per pt preference

## 2022-03-05 NOTE — Assessment & Plan Note (Signed)
Chronic, stable Notes he begins drinking when he feels anxious or paniced Typically drinks 6 beers/night or 3 whiskeys Encouraged to cut back to 2 drinks/day slowly to prevent chronic conditions including cirrhosis

## 2022-03-25 ENCOUNTER — Other Ambulatory Visit: Payer: Self-pay | Admitting: Family Medicine

## 2022-03-25 DIAGNOSIS — F411 Generalized anxiety disorder: Secondary | ICD-10-CM

## 2022-03-25 NOTE — Telephone Encounter (Signed)
Requested Prescriptions  Pending Prescriptions Disp Refills   escitalopram (LEXAPRO) 20 MG tablet [Pharmacy Med Name: ESCITALOPRAM 20 MG TABLET] 135 tablet     Sig: TAKE 1 AND 1/2 TABLETS DAILY BY MOUTH     Psychiatry:  Antidepressants - SSRI Passed - 03/25/2022  2:32 PM      Passed - Valid encounter within last 6 months    Recent Outpatient Visits           2 weeks ago GAD (generalized anxiety disorder)   [place] Tally Joe T, FNP   2 months ago Gastroesophageal reflux disease with esophagitis without hemorrhage   [place] Tally Joe T, FNP   4 months ago GAD (generalized anxiety disorder)   [place] Gwyneth Sprout, FNP   11 months ago Annual physical exam   [facility] Gwyneth Sprout, [place]   1 year ago GAD (generalized anxiety disorder)   [place] Bacigal[place], Dionne Bucy, MD       Future Appointments             In 3 weeks Gwyneth Sprout, [place], [place]

## 2022-03-27 ENCOUNTER — Other Ambulatory Visit: Payer: Self-pay | Admitting: Family Medicine

## 2022-03-27 DIAGNOSIS — F5104 Psychophysiologic insomnia: Secondary | ICD-10-CM

## 2022-03-29 ENCOUNTER — Other Ambulatory Visit: Payer: Self-pay

## 2022-03-29 ENCOUNTER — Emergency Department
Admission: EM | Admit: 2022-03-29 | Discharge: 2022-03-29 | Disposition: A | Discharge status: Home / Self Care | Payer: BC Managed Care – PPO | Attending: Emergency Medicine | Admitting: Emergency Medicine

## 2022-03-29 DIAGNOSIS — T50901A Poisoning by unspecified drugs, medicaments and biological substances, accidental (unintentional), initial encounter: Secondary | ICD-10-CM | POA: Diagnosis not present

## 2022-03-29 DIAGNOSIS — X58XXXA Exposure to other specified factors, initial encounter: Secondary | ICD-10-CM | POA: Insufficient documentation

## 2022-03-29 LAB — URINE DRUG SCREEN, QUALITATIVE (ARMC ONLY)
Amphetamines, Ur Screen: NOT DETECTED
Barbiturates, Ur Screen: NOT DETECTED
Benzodiazepine, Ur Scrn: NOT DETECTED
Cannabinoid 50 Ng, Ur ~~LOC~~: NOT DETECTED
Cocaine Metabolite,Ur ~~LOC~~: NOT DETECTED
MDMA (Ecstasy)Ur Screen: NOT DETECTED
Methadone Scn, Ur: NOT DETECTED
Opiate, Ur Screen: NOT DETECTED
Phencyclidine (PCP) Ur S: NOT DETECTED
Tricyclic, Ur Screen: NOT DETECTED

## 2022-03-29 MED ORDER — LORAZEPAM 1 MG PO TABS
1.0000 mg | ORAL_TABLET | Freq: Once | ORAL | Status: AC
  Administered 2022-03-29: 1 mg via ORAL
  Filled 2022-03-29: qty 1

## 2022-03-29 NOTE — ED Triage Notes (Signed)
Pt to ED for ingestion of "acid or something" last night, unknown specific time. States "I feel really high, really messed up". Nad noted. RR even and unlabored. Answering orientation questions appropriaetly.  +nausea

## 2022-03-29 NOTE — ED Provider Notes (Signed)
   [facility] Provider Note    Event Date/Time   First MD Initiated Contact with Patient 03/29/22 1030     (approximate)   History   Ingestion   HPI  Todd Fleming is a 37 y.o. male who presents with complaint of "I feel like I am tripping balls".  Last night at [employer] party patient took a "pill "recreationally which he thinks may have been LSD but he is not sure.  He states that he is still feels high which is alarming him.  No other complaints     Physical Exam   Triage Vital Signs: ED Triage Vitals  Enc Vitals Group     BP 03/29/22 1017 (!) 138/92     Pulse Rate 03/29/22 1017 93     Resp 03/29/22 1017 17     Temp 03/29/22 1017 98.4 F (36.9 C)     Temp Source 03/29/22 1017 Oral     SpO2 03/29/22 1017 100 %     Weight 03/29/22 1018 75.8 kg (167 lb)     Height 03/29/22 1018 1.727 m (5\' 8" )     Head Circumference --      Peak Flow --      Pain Score 03/29/22 1027 0     Pain Loc --      Pain Edu? --      Excl. in [place]? --     Most recent vital signs: Vitals:   03/29/22 1017  BP: (!) 138/92  Pulse: 93  Resp: 17  Temp: 98.4 F (36.9 C)  SpO2: 100%     General: Awake, no distress.  CV:  Good peripheral perfusion.  Resp:  Normal effort.  Abd:  No distention.  Other:     ED Results / Procedures / Treatments   Labs (all labs ordered are listed, but only abnormal results are displayed) Labs Reviewed  URINE DRUG SCREEN, QUALITATIVE ([place])     EKG    RADIOLOGY     PROCEDURES:  Critical Care performed:   Procedures   MEDICATIONS ORDERED IN ED: Medications  LORazepam (ATIVAN) tablet 1 mg (1 mg Oral Given 03/29/22 1049)     IMPRESSION / MDM / [place] / ED COURSE  I reviewed the triage vital signs and the nursing notes. Patient's presentation is most consistent with acute illness / injury with system symptoms.   Patient presents with symptoms as above.  Overall well-appearing with  reassuring vital signs.  He is lucid without psychosis.  Will give p.o. Ativan, urine drug screen sent although this would not change treatment plan which is rest at home, fluids  Patient understands, he will stay with his mother who is here and agrees with this plan as well.       FINAL CLINICAL IMPRESSION(S) / ED DIAGNOSES   Final diagnoses:  Ingestion of unknown drug, accidental or unintentional, initial encounter     Rx / DC Orders   ED Discharge Orders     None        Note:  This document was prepared using Dragon voice recognition software and may include unintentional dictation errors.   Lavonia Drafts, MD 03/29/22 1057

## 2022-03-29 NOTE — ED Triage Notes (Signed)
First Nurse Note:  Arrives stating "I took something last night and now I'm freaking out:.    AAOx3.  Skin warm and dry.  Anxious. NAD

## 2022-04-09 ENCOUNTER — Ambulatory Visit: Payer: Self-pay | Admitting: Family Medicine

## 2022-04-16 ENCOUNTER — Ambulatory Visit (INDEPENDENT_AMBULATORY_CARE_PROVIDER_SITE_OTHER): Payer: BC Managed Care – PPO | Admitting: Family Medicine

## 2022-04-16 ENCOUNTER — Encounter: Payer: Self-pay | Admitting: Family Medicine

## 2022-04-16 VITALS — BP 115/77 | HR 102 | Temp 98.0°F | Resp 16 | Wt 182.7 lb

## 2022-04-16 DIAGNOSIS — F1020 Alcohol dependence, uncomplicated: Secondary | ICD-10-CM | POA: Diagnosis not present

## 2022-04-16 DIAGNOSIS — F5104 Psychophysiologic insomnia: Secondary | ICD-10-CM

## 2022-04-16 DIAGNOSIS — R739 Hyperglycemia, unspecified: Secondary | ICD-10-CM | POA: Diagnosis not present

## 2022-04-16 DIAGNOSIS — F411 Generalized anxiety disorder: Secondary | ICD-10-CM

## 2022-04-16 DIAGNOSIS — Z23 Encounter for immunization: Secondary | ICD-10-CM

## 2022-04-16 DIAGNOSIS — Z0000 Encounter for general adult medical examination without abnormal findings: Secondary | ICD-10-CM | POA: Diagnosis not present

## 2022-04-16 DIAGNOSIS — F17298 Nicotine dependence, other tobacco product, with other nicotine-induced disorders: Secondary | ICD-10-CM

## 2022-04-16 DIAGNOSIS — K2100 Gastro-esophageal reflux disease with esophagitis, without bleeding: Secondary | ICD-10-CM

## 2022-04-16 NOTE — Progress Notes (Unsigned)
I,Todd Fleming,acting as a Education administrator for Todd Sprout, FNP.,have documented all relevant documentation on the behalf of Todd Sprout, FNP,as directed by  Todd Sprout, FNP while in the presence of Todd Sprout, FNP.  Complete physical exam  Patient: Todd Fleming   DOB: March 31, 1985   37 y.o. Male  MRN: [id number] Visit Date: 04/16/2022  Today's healthcare provider: Gwyneth Sprout, FNP  Re Introduced to nurse practitioner role and practice setting.  All questions answered.  Discussed provider/patient relationship and expectations.  Chief Complaint  Patient presents with   Annual Exam   Subjective    Todd Fleming is a 37 y.o. male who presents today for a complete physical exam.  He reports consuming a general diet. The patient has a physically strenuous job, but has no regular exercise apart from work.  He generally feels well. He reports sleeping well. He does not have additional problems to discuss today.  HPI   History reviewed. No pertinent past medical history. Past Surgical History:  Procedure Laterality Date   HERNIA REPAIR     Social History   Socioeconomic History   Marital status: Single    Spouse name: Not on file   Number of children: Not on file   Years of education: Not on file   Highest education level: Not on file  Occupational History   Not on file  Tobacco Use   Smoking status: Every Day    Types: E-cigarettes   Smokeless tobacco: Former    Types: Chew    Quit date: 02/15/2018   Tobacco comments:    1-1.5 cartridges/week; 5%    02/2019 quit 1 ppd  Substance and Sexual Activity   Alcohol use: Yes   Drug use: Not on file   Sexual activity: Not on file  Other Topics Concern   Not on file  Social History Narrative   Not on file   Social Determinants of Health   Financial Resource Strain: Not on file  Food Insecurity: Not on file  Transportation Needs: Not on file  Physical Activity: Not on file  Stress: Not on file  Social  Connections: Not on file  Intimate Partner Violence: Not on file   No family status information on file.   History reviewed. No pertinent family history. No Known Allergies  Patient Care Team: Todd Sprout, FNP as PCP - General (Family Medicine)   Medications: Outpatient Medications Prior to Visit  Medication Sig   escitalopram (LEXAPRO) 20 MG tablet Take 2 tablets (40 mg total) by mouth daily.   omeprazole (PRILOSEC) 40 MG capsule Take 1 capsule (40 mg total) by mouth daily.   traZODone (DESYREL) 100 MG tablet TAKE 1 TABLET BY MOUTH EVERYDAY AT BEDTIME   No facility-administered medications prior to visit.    Review of Systems  Constitutional:        Irritability   Psychiatric/Behavioral:  The patient is nervous/anxious.   All other systems reviewed and are negative.  Last CBC Lab Results  Component Value Date   WBC 7.0 04/16/2022   HGB 15.3 04/16/2022   HCT 47.1 04/16/2022   MCV 84 04/16/2022   MCH 27.3 04/16/2022   RDW 12.2 04/16/2022   PLT 289 [id number]   Last metabolic panel Lab Results  Component Value Date   GLUCOSE 85 04/16/2022   NA 141 04/16/2022   K 4.2 04/16/2022   CL 104 04/16/2022   CO2 18 (L) 04/16/2022   BUN 15 04/16/2022  CREATININE 1.26 04/16/2022   EGFR 75 04/16/2022   CALCIUM 9.3 04/16/2022   PROT 6.4 04/16/2022   ALBUMIN 4.4 04/16/2022   LABGLOB 2.0 04/16/2022   AGRATIO 2.2 04/16/2022   BILITOT 0.4 04/16/2022   ALKPHOS 100 04/16/2022   AST 31 04/16/2022   ALT 73 (H) 04/16/2022   ANIONGAP 10 09/04/2017   Last lipids Lab Results  Component Value Date   CHOL 210 (H) 04/16/2022   HDL 55 04/16/2022   LDLCALC 126 (H) 04/16/2022   TRIG 162 (H) 04/16/2022   CHOLHDL 3.8 04/16/2022   Last hemoglobin A1c Lab Results  Component Value Date   HGBA1C 5.2 04/16/2022   Last thyroid functions Lab Results  Component Value Date   TSH 0.712 04/16/2022   Last vitamin D Lab Results  Component Value Date   VD25OH 17.0 (L) 04/16/2022       Objective    BP 115/77 (BP Location: Left Arm, Patient Position: Sitting, Cuff Size: Large)   Pulse (!) 102   Temp 98 F (36.7 C) (Temporal)   Resp 16   Wt 182 lb 11.2 oz (82.9 kg)   BMI 27.78 kg/m   BP Readings from Last 3 Encounters:  04/16/22 115/77  03/29/22 (!) 138/92  03/05/22 128/78   Wt Readings from Last 3 Encounters:  04/16/22 182 lb 11.2 oz (82.9 kg)  03/29/22 167 lb (75.8 kg)  03/05/22 176 lb 4.8 oz (80 kg)   Physical Exam Vitals and nursing note reviewed.  Constitutional:      General: He is awake. He is not in acute distress.    Appearance: Normal appearance. He is well-developed, well-groomed and overweight. He is not ill-appearing, toxic-appearing or diaphoretic.  HENT:     Head: Normocephalic and atraumatic.     Jaw: There is normal jaw occlusion. No trismus, tenderness, swelling or pain on movement.     Salivary Glands: Right salivary gland is not diffusely enlarged or tender. Left salivary gland is not diffusely enlarged or tender.     Right Ear: Hearing, ear canal and external ear normal. There is impacted cerumen.     Left Ear: Hearing, ear canal and external ear normal. There is impacted cerumen.     Ears:     Comments: Declines irrigation at this time     Nose: Nose normal. No congestion or rhinorrhea.     Right Turbinates: Not enlarged, swollen or pale.     Left Turbinates: Not enlarged, swollen or pale.     Right Sinus: No maxillary sinus tenderness or frontal sinus tenderness.     Left Sinus: No maxillary sinus tenderness or frontal sinus tenderness.     Mouth/Throat:     Lips: Pink.     Mouth: Mucous membranes are moist. No injury, lacerations, oral lesions or angioedema.     Pharynx: Oropharynx is clear. Uvula midline. No pharyngeal swelling, oropharyngeal exudate or posterior oropharyngeal erythema.     Tonsils: No tonsillar exudate or tonsillar abscesses.  Eyes:     General: Lids are normal. Vision grossly intact. Gaze aligned  appropriately.        Right eye: No discharge.        Left eye: No discharge.     Extraocular Movements: Extraocular movements intact.     Conjunctiva/sclera: Conjunctivae normal.     Pupils: Pupils are equal, round, and reactive to light.  Neck:     Thyroid: No thyroid mass, thyromegaly or thyroid tenderness.     Vascular: No carotid  bruit.     Trachea: Trachea normal. No tracheal tenderness.  Cardiovascular:     Rate and Rhythm: Regular rhythm. Tachycardia present.     Pulses: Normal pulses.          Carotid pulses are 2+ on the right side and 2+ on the left side.      Radial pulses are 2+ on the right side and 2+ on the left side.       Femoral pulses are 2+ on the right side and 2+ on the left side.      Popliteal pulses are 2+ on the right side and 2+ on the left side.       Dorsalis pedis pulses are 2+ on the right side and 2+ on the left side.       Posterior tibial pulses are 2+ on the right side and 2+ on the left side.     Heart sounds: Normal heart sounds, S1 normal and S2 normal. No murmur heard.    No friction rub. No gallop.  Pulmonary:     Effort: Pulmonary effort is normal. No respiratory distress.     Breath sounds: Normal breath sounds and air entry. No stridor. No wheezing, rhonchi or rales.  Chest:     Chest wall: No tenderness.  Abdominal:     General: Abdomen is flat. Bowel sounds are normal. There is no distension.     Palpations: Abdomen is soft. There is no mass.     Tenderness: There is no abdominal tenderness. There is no guarding or rebound.     Hernia: No hernia is present.  Genitourinary:    Comments: Exam deferred; denies complaints Musculoskeletal:        General: No swelling, tenderness, deformity or signs of injury. Normal range of motion.     Cervical back: Normal range of motion and neck supple. No rigidity or tenderness.     Right lower leg: No edema.     Left lower leg: No edema.  Lymphadenopathy:     Cervical: No cervical adenopathy.      Right cervical: No superficial, deep or posterior cervical adenopathy.    Left cervical: No superficial, deep or posterior cervical adenopathy.  Skin:    General: Skin is warm and dry.     Capillary Refill: Capillary refill takes less than 2 seconds.     Coloration: Skin is not jaundiced or pale.     Findings: Erythema present. No bruising, lesion or rash.     Comments: Slight erythema along belt line; continue to monitor.   Neurological:     General: No focal deficit present.     Mental Status: He is alert and oriented to person, place, and time. Mental status is at baseline.     GCS: GCS eye subscore is 4. GCS verbal subscore is 5. GCS motor subscore is 6.     Sensory: Sensation is intact. No sensory deficit.     Motor: Motor function is intact. No weakness.     Coordination: Coordination is intact.     Gait: Gait is intact.  Psychiatric:        Attention and Perception: Attention and perception normal.        Mood and Affect: Mood and affect normal.        Speech: Speech normal.        Behavior: Behavior normal. Behavior is cooperative.        Thought Content: Thought content normal.  Cognition and Memory: Cognition normal.        Judgment: Judgment normal.    Last depression screening scores    04/16/2022    3:54 PM 03/05/2022    4:09 PM 01/01/2022    3:44 PM  PHQ 2/9 Scores  PHQ - 2 Score '1 3 4  '$ PHQ- 9 Score '1 6 7   '$ Last fall risk screening    04/16/2022    3:54 PM  [place] in the past year? 0  Number falls in past yr: 0  Injury with Fall? 0  Risk for fall due to : No Fall Risks  Follow up Falls evaluation completed   Last Audit-C alcohol use screening    04/16/2022    3:54 PM  Alcohol Use Disorder Test (AUDIT)  1. How often do you have a drink containing alcohol? 4  2. How many drinks containing alcohol do you have on a typical day when you are drinking? 2  3. How often do you have six or more drinks on one occasion? 3  AUDIT-C Score 9   A score  of 3 or more in women, and 4 or more in men indicates increased risk for alcohol abuse, EXCEPT if all of the points are from question 1   Results for orders placed or performed in visit on 04/16/22  CBC with Differential/Platelet  Result Value Ref Range   WBC 7.0 3.4 - 10.8 x10E3/uL   RBC 5.61 4.14 - 5.80 x10E6/uL   Hemoglobin 15.3 13.0 - 17.7 g/dL   Hematocrit 47.1 37.5 - 51.0 %   MCV 84 79 - 97 fL   MCH 27.3 26.6 - 33.0 pg   MCHC 32.5 31.5 - 35.7 g/dL   RDW 12.2 11.6 - 15.4 %   Platelets 289 150 - 450 x10E3/uL   Neutrophils 64 Not Estab. %   Lymphs 19 Not Estab. %   Monocytes 11 Not Estab. %   Eos 5 Not Estab. %   Basos 1 Not Estab. %   Neutrophils Absolute 4.5 1.4 - 7.0 x10E3/uL   Lymphocytes Absolute 1.4 0.7 - 3.1 x10E3/uL   Monocytes Absolute 0.8 0.1 - 0.9 x10E3/uL   EOS (ABSOLUTE) 0.3 0.0 - 0.4 x10E3/uL   Basophils Absolute 0.1 0.0 - 0.2 x10E3/uL   Immature Granulocytes 0 Not Estab. %   Immature Grans (Abs) 0.0 0.0 - 0.1 x10E3/uL  Comprehensive Metabolic Panel (CMET)  Result Value Ref Range   Glucose 85 70 - 99 mg/dL   BUN 15 6 - 20 mg/dL   Creatinine, Ser 1.26 0.76 - 1.27 mg/dL   eGFR 75 >59 mL/min/1.73   BUN/Creatinine Ratio 12 9 - 20   Sodium 141 134 - 144 mmol/L   Potassium 4.2 3.5 - 5.2 mmol/L   Chloride 104 96 - 106 mmol/L   CO2 18 (L) 20 - 29 mmol/L   Calcium 9.3 8.7 - 10.2 mg/dL   Total Protein 6.4 6.0 - 8.5 g/dL   Albumin 4.4 4.1 - 5.1 g/dL   Globulin, Total 2.0 1.5 - 4.5 g/dL   Albumin/Globulin Ratio 2.2 1.2 - 2.2   Bilirubin Total 0.4 0.0 - 1.2 mg/dL   Alkaline Phosphatase 100 44 - 121 IU/L   AST 31 0 - 40 IU/L   ALT 73 (H) 0 - 44 IU/L  TSH + free T4  Result Value Ref Range   TSH 0.712 0.450 - 4.500 uIU/mL   Free T4 1.31 0.82 - 1.77 ng/dL  Hemoglobin  A1c  Result Value Ref Range   Hgb A1c MFr Bld 5.2 4.8 - 5.6 %   Est. average glucose Bld gHb Est-mCnc 103 mg/dL  Lipid panel  Result Value Ref Range   Cholesterol, Total 210 (H) 100 - 199 mg/dL    Triglycerides 162 (H) 0 - 149 mg/dL   HDL 55 >39 mg/dL   VLDL Cholesterol Cal 29 5 - 40 mg/dL   LDL Chol Calc (NIH) 126 (H) 0 - 99 mg/dL   Chol/HDL Ratio 3.8 0.0 - 5.0 ratio  Vitamin D (25 hydroxy)  Result Value Ref Range   Vit D, 25-Hydroxy 17.0 (L) 30.0 - 100.0 ng/mL    Assessment & Plan    Routine Health Maintenance and Physical Exam  Exercise Activities and Dietary recommendations  Goals   None     Immunization History  Administered Date(s) Administered   DTaP 08/26/1989   Hepatitis B, PED/ADOLESCENT 12/07/1995, 01/04/1996, 06/06/1996   IPV 08/26/1989   Tdap 04/16/2022    Health Maintenance  Topic Date Due   COVID-19 Vaccine (1) Never done   INFLUENZA VACCINE  05/16/2022 (Originally 09/15/2021)   DTaP/Tdap/Td (3 - Td or Tdap) 04/15/2032   Hepatitis C Screening  Completed   HIV Screening  Completed   HPV VACCINES  Aged Out    Discussed health benefits of physical activity, and encouraged him to engage in regular exercise appropriate for his age and condition.  Problem List Items Addressed This Visit       Digestive   Gastroesophageal reflux disease with esophagitis without hemorrhage    Chronic, stable Notes that if he misses a dose of prilosec; his s/s return Continue prilosec 40 mg daily at this time If continues to worsen despite recommendations for ETOH reduction, heart healthy diet, and working to maintain a BMI <25 I would recommend f/u with GI.        Other   Alcohol use disorder, moderate, dependence (HCC)    Chronic, slightly worse Pt notes that no one at his job likes his boss, and he feels as if he has to come home and have a drink We have previously discussed ETOH moderation and concern for worsening organ function with overuse and/or misuse Patient does not feel that his "alcohol use is a problem" at this time. Previously has been to rehab; however, relapsed within days of return to home.       Annual physical exam - Primary    No acute  complaints at this time Encourage annual/bi-annual visits to eye dr, and dentist Things to do to keep yourself healthy  - Exercise at least 30-45 minutes a day, 3-4 days a week.  - Eat a low-fat diet with lots of fruits and vegetables, up to 7-9 servings per day.  - Seatbelts can save your life. Wear them always.  - Smoke detectors on every level of your home, check batteries every year.  - Eye Doctor - have an eye exam every 1-2 years  - Safe sex - if you may be exposed to STDs, use a condom.  - Alcohol -  If you drink, do it moderately, less than 2 drinks per day.  - [place]. Choose someone to speak for you if you are not able.  - Depression is common in our stressful world.If you're feeling down or losing interest in things you normally enjoy, please come in for a visit.  - Violence - If anyone is threatening or hurting you, please call  immediately.       Relevant Orders   CBC with Differential/Platelet (Completed)   Comprehensive Metabolic Panel (CMET) (Completed)   TSH + free T4 (Completed)   Lipid panel (Completed)   Elevated serum glucose    Recommend DM screening given elevation in BG Continue to recommend balanced, lower carb meals. Smaller meal size, adding snacks. Choosing water as drink of choice and increasing purposeful exercise.       Relevant Orders   Hemoglobin A1c (Completed)   GAD (generalized anxiety disorder)    Chronic, stable Continue Lexapro 20 x2 tablets at this time Denies SI or HI; continue to encourage finding the glimmers in life and working to pop the bubble of anxiety triggers Recommend Vit D screening to assist with mood mgmt      Relevant Orders   Vitamin D (25 hydroxy) (Completed)   Need for Tdap vaccination    Consented; VIS made available; no immediate side effects following administration; plan to repeat every 10 years or PRN       Relevant Orders   Tdap vaccine greater than or equal to 7yo IM (Completed)    Nicotine dependence    Chronic, stable Pt defers cessation efforts at this time; has been vaping. Does not feel his use has changed. Continue to recommend reduction with goal of cessation.      Psychophysiological insomnia    Chronic, improved Continue trazodone at 100 mg nightly to assist RTC if s/s change and/or worsen      Relevant Orders   Vitamin D (25 hydroxy) (Completed)   Return in about 1 year (around 04/16/2023) for annual examination.    Vonna Kotyk, FNP, have reviewed all documentation for this visit. The documentation on 04/18/22 for the exam, diagnosis, procedures, and orders are all accurate and complete.  Todd Fleming, [place] [phone] (phone) [phone] (fax)  [place]

## 2022-04-17 LAB — LIPID PANEL
Chol/HDL Ratio: 3.8 ratio (ref 0.0–5.0)
Cholesterol, Total: 210 mg/dL — ABNORMAL HIGH (ref 100–199)
HDL: 55 mg/dL (ref >39)
LDL Chol Calc (NIH): 126 mg/dL — ABNORMAL HIGH (ref 0–99)
Triglycerides: 162 mg/dL — ABNORMAL HIGH (ref 0–149)
VLDL Cholesterol Cal: 29 mg/dL (ref 5–40)

## 2022-04-17 LAB — COMPREHENSIVE METABOLIC PANEL
ALT: 73 IU/L — ABNORMAL HIGH (ref 0–44)
AST: 31 IU/L (ref 0–40)
Albumin/Globulin Ratio: 2.2 (ref 1.2–2.2)
Albumin: 4.4 g/dL (ref 4.1–5.1)
Alkaline Phosphatase: 100 IU/L (ref 44–121)
BUN/Creatinine Ratio: 12 (ref 9–20)
BUN: 15 mg/dL (ref 6–20)
Bilirubin Total: 0.4 mg/dL (ref 0.0–1.2)
CO2: 18 mmol/L — ABNORMAL LOW (ref 20–29)
Calcium: 9.3 mg/dL (ref 8.7–10.2)
Chloride: 104 mmol/L (ref 96–106)
Creatinine, Ser: 1.26 mg/dL (ref 0.76–1.27)
Globulin, Total: 2 g/dL (ref 1.5–4.5)
Glucose: 85 mg/dL (ref 70–99)
Potassium: 4.2 mmol/L (ref 3.5–5.2)
Sodium: 141 mmol/L (ref 134–144)
Total Protein: 6.4 g/dL (ref 6.0–8.5)
eGFR: 75 mL/min/{1.73_m2} (ref >59)

## 2022-04-17 LAB — VITAMIN D 25 HYDROXY (VIT D DEFICIENCY, FRACTURES): Vit D, 25-Hydroxy: 17 ng/mL — ABNORMAL LOW (ref 30.0–100.0)

## 2022-04-17 LAB — CBC WITH DIFFERENTIAL/PLATELET
Basophils Absolute: 0.1 10*3/uL (ref 0.0–0.2)
Basos: 1 %
EOS (ABSOLUTE): 0.3 10*3/uL (ref 0.0–0.4)
Eos: 5 %
Hematocrit: 47.1 % (ref 37.5–51.0)
Hemoglobin: 15.3 g/dL (ref 13.0–17.7)
Immature Grans (Abs): 0 10*3/uL (ref 0.0–0.1)
Immature Granulocytes: 0 %
Lymphocytes Absolute: 1.4 10*3/uL (ref 0.7–3.1)
Lymphs: 19 %
MCH: 27.3 pg (ref 26.6–33.0)
MCHC: 32.5 g/dL (ref 31.5–35.7)
MCV: 84 fL (ref 79–97)
Monocytes Absolute: 0.8 10*3/uL (ref 0.1–0.9)
Monocytes: 11 %
Neutrophils Absolute: 4.5 10*3/uL (ref 1.4–7.0)
Neutrophils: 64 %
Platelets: 289 10*3/uL (ref 150–450)
RBC: 5.61 x10E6/uL (ref 4.14–5.80)
RDW: 12.2 % (ref 11.6–15.4)
WBC: 7 10*3/uL (ref 3.4–10.8)

## 2022-04-17 LAB — TSH+FREE T4
Free T4: 1.31 ng/dL (ref 0.82–1.77)
TSH: 0.712 u[IU]/mL (ref 0.450–4.500)

## 2022-04-17 LAB — HEMOGLOBIN A1C
Est. average glucose Bld gHb Est-mCnc: 103 mg/dL
Hgb A1c MFr Bld: 5.2 % (ref 4.8–5.6)

## 2022-04-18 ENCOUNTER — Other Ambulatory Visit: Payer: Self-pay | Admitting: Family Medicine

## 2022-04-18 DIAGNOSIS — R748 Abnormal levels of other serum enzymes: Secondary | ICD-10-CM

## 2022-04-18 DIAGNOSIS — E559 Vitamin D deficiency, unspecified: Secondary | ICD-10-CM

## 2022-04-18 MED ORDER — VITAMIN D3 125 MCG (5000 UT) PO CAPS: 5000.0000 [IU] | ORAL_CAPSULE | Freq: Every day | ORAL | 0 refills | Status: AC

## 2022-04-18 NOTE — Progress Notes (Signed)
Hi Todd Fleming,  ALT, a liver enzyme is elevated. This can come from overuse of NSAIDs like Advil or from excess alcohol intake. Recommend repeat liver function panel in 1 month. If elevation remains, recommendation would be for liver ultrasound to assist.  Cholesterol is elevated; total, fats/triglycerides, LDL/bad cholesterol are all elevated. I continue to recommend diet low in saturated fat and regular exercise - 30 min at least 5 times per week  Vit D is now low. Recommend 5000 IU daily, available OTC. Recommend repeat Vit D in 3-6 months.  Normal cell count and thyroid. No pre-diabetes.  Please let us know if you have any questions.  Thank you, Todd Fleming, [address] [place], [name] [address] [phone] (phone) [phone] (fax) [place]

## 2022-04-18 NOTE — Assessment & Plan Note (Signed)
Chronic, stable Notes that if he misses a dose of prilosec; his s/s return Continue prilosec 40 mg daily at this time If continues to worsen despite recommendations for ETOH reduction, heart healthy diet, and working to maintain a BMI <25 I would recommend f/u with GI.

## 2022-04-18 NOTE — Assessment & Plan Note (Signed)
Chronic, slightly worse Pt notes that no one at his job likes his boss, and he feels as if he has to come home and have a drink We have previously discussed ETOH moderation and concern for worsening organ function with overuse and/or misuse Patient does not feel that his "alcohol use is a problem" at this time. Previously has been to rehab; however, relapsed within days of return to home.

## 2022-04-18 NOTE — Assessment & Plan Note (Signed)
Chronic, stable Pt defers cessation efforts at this time; has been vaping. Does not feel his use has changed. Continue to recommend reduction with goal of cessation.

## 2022-04-18 NOTE — Assessment & Plan Note (Signed)
Recommend DM screening given elevation in BG Continue to recommend balanced, lower carb meals. Smaller meal size, adding snacks. Choosing water as drink of choice and increasing purposeful exercise.

## 2022-04-18 NOTE — Assessment & Plan Note (Signed)
No acute complaints at this time Encourage annual/bi-annual visits to eye dr, and dentist Things to do to keep yourself healthy  - Exercise at least 30-45 minutes a day, 3-4 days a week.  - Eat a low-fat diet with lots of fruits and vegetables, up to 7-9 servings per day.  - Seatbelts can save your life. Wear them always.  - Smoke detectors on every level of your home, check batteries every year.  - Eye Doctor - have an eye exam every 1-2 years  - Safe sex - if you may be exposed to STDs, use a condom.  - Alcohol -  If you drink, do it moderately, less than 2 drinks per day.  - [place]. Choose someone to speak for you if you are not able.  - Depression is common in our stressful world.If you're feeling down or losing interest in things you normally enjoy, please come in for a visit.  - Violence - If anyone is threatening or hurting you, please call immediately.

## 2022-04-18 NOTE — Assessment & Plan Note (Signed)
Consented; VIS made available; no immediate side effects following administration; plan to repeat every 10 years or PRN

## 2022-04-18 NOTE — Assessment & Plan Note (Addendum)
Chronic, stable Continue Lexapro 20 x2 tablets at this time Denies SI or HI; continue to encourage finding the glimmers in life and working to pop the bubble of anxiety triggers Recommend Vit D screening to assist with mood mgmt

## 2022-04-18 NOTE — Assessment & Plan Note (Signed)
Chronic, improved Continue trazodone at 100 mg nightly to assist RTC if s/s change and/or worsen

## 2022-04-19 ENCOUNTER — Telehealth: Payer: Self-pay | Admitting: Family Medicine

## 2022-04-19 NOTE — Telephone Encounter (Signed)
Patient called for lab results. Advised patient of results and advised patient of providers's recommendations for follow up blood work in 1 month. Patient verbal understanding.

## 2022-04-21 ENCOUNTER — Encounter: Payer: Self-pay | Admitting: Family Medicine

## 2022-06-04 ENCOUNTER — Ambulatory Visit: Payer: BC Managed Care – PPO | Attending: Family Medicine

## 2022-10-10 ENCOUNTER — Other Ambulatory Visit: Payer: Self-pay | Admitting: Family Medicine

## 2022-10-10 DIAGNOSIS — F5104 Psychophysiologic insomnia: Secondary | ICD-10-CM

## 2022-11-10 ENCOUNTER — Other Ambulatory Visit: Payer: Self-pay

## 2022-11-10 ENCOUNTER — Emergency Department
Admission: EM | Admit: 2022-11-10 | Discharge: 2022-11-10 | Disposition: A | Discharge status: Home / Self Care | Payer: BC Managed Care – PPO | Attending: Student in an Organized Health Care Education/Training Program | Admitting: Student in an Organized Health Care Education/Training Program

## 2022-11-10 ENCOUNTER — Encounter: Payer: Self-pay | Admitting: *Deleted

## 2022-11-10 ENCOUNTER — Emergency Department: Payer: BC Managed Care – PPO

## 2022-11-10 DIAGNOSIS — R1013 Epigastric pain: Secondary | ICD-10-CM

## 2022-11-10 DIAGNOSIS — R109 Unspecified abdominal pain: Secondary | ICD-10-CM | POA: Diagnosis not present

## 2022-11-10 DIAGNOSIS — K760 Fatty (change of) liver, not elsewhere classified: Secondary | ICD-10-CM | POA: Diagnosis not present

## 2022-11-10 DIAGNOSIS — R9431 Abnormal electrocardiogram [ECG] [EKG]: Secondary | ICD-10-CM | POA: Diagnosis not present

## 2022-11-10 DIAGNOSIS — R1012 Left upper quadrant pain: Secondary | ICD-10-CM | POA: Diagnosis not present

## 2022-11-10 LAB — COMPREHENSIVE METABOLIC PANEL
ALT: 86 U/L — ABNORMAL HIGH (ref 0–44)
AST: 61 U/L — ABNORMAL HIGH (ref 15–41)
Albumin: 4.8 g/dL (ref 3.5–5.0)
Alkaline Phosphatase: 65 U/L (ref 38–126)
Anion gap: 16 — ABNORMAL HIGH (ref 5–15)
BUN: 15 mg/dL (ref 6–20)
CO2: 20 mmol/L — ABNORMAL LOW (ref 22–32)
Calcium: 9.6 mg/dL (ref 8.9–10.3)
Chloride: 100 mmol/L (ref 98–111)
Creatinine, Ser: 1.16 mg/dL (ref 0.61–1.24)
GFR, Estimated: >60 mL/min (ref >60)
Glucose, Bld: 101 mg/dL — ABNORMAL HIGH (ref 70–99)
Potassium: 3.9 mmol/L (ref 3.5–5.1)
Sodium: 136 mmol/L (ref 135–145)
Total Bilirubin: 1.8 mg/dL — ABNORMAL HIGH (ref 0.3–1.2)
Total Protein: 8.2 g/dL — ABNORMAL HIGH (ref 6.5–8.1)

## 2022-11-10 LAB — URINALYSIS, ROUTINE W REFLEX MICROSCOPIC
Bilirubin Urine: NEGATIVE
Glucose, UA: NEGATIVE mg/dL
Hgb urine dipstick: NEGATIVE
Ketones, ur: 80 mg/dL — AB
Leukocytes,Ua: NEGATIVE
Nitrite: NEGATIVE
Protein, ur: NEGATIVE mg/dL
Specific Gravity, Urine: 1.026 (ref 1.005–1.030)
pH: 5 (ref 5.0–8.0)

## 2022-11-10 LAB — CBC
HCT: 47.6 % (ref 39.0–52.0)
Hemoglobin: 16 g/dL (ref 13.0–17.0)
MCH: 28.9 pg (ref 26.0–34.0)
MCHC: 33.6 g/dL (ref 30.0–36.0)
MCV: 85.9 fL (ref 80.0–100.0)
Platelets: 330 10*3/uL (ref 150–400)
RBC: 5.54 MIL/uL (ref 4.22–5.81)
RDW: 12.9 % (ref 11.5–15.5)
WBC: 14.8 10*3/uL — ABNORMAL HIGH (ref 4.0–10.5)
nRBC: 0 % (ref 0.0–0.2)

## 2022-11-10 LAB — LIPASE, BLOOD: Lipase: 26 U/L (ref 11–51)

## 2022-11-10 MED ORDER — DEXTROSE 5 % IN LACTATED RINGERS IV BOLUS
1000.0000 mL | Freq: Once | INTRAVENOUS | Status: AC
  Administered 2022-11-10: 1000 mL via INTRAVENOUS

## 2022-11-10 MED ORDER — PANTOPRAZOLE SODIUM 40 MG PO TBEC
40.0000 mg | DELAYED_RELEASE_TABLET | Freq: Once | ORAL | Status: AC
  Administered 2022-11-10: 40 mg via ORAL
  Filled 2022-11-10: qty 1

## 2022-11-10 MED ORDER — ONDANSETRON 4 MG PO TBDP: 4.0000 mg | ORAL_TABLET | Freq: Three times a day (TID) | ORAL | 0 refills | Status: AC | PRN

## 2022-11-10 MED ORDER — PANTOPRAZOLE SODIUM 40 MG PO TBEC: 40.0000 mg | DELAYED_RELEASE_TABLET | Freq: Every day | ORAL | 1 refills | Status: DC

## 2022-11-10 MED ORDER — IOHEXOL 300 MG/ML  SOLN
100.0000 mL | Freq: Once | INTRAMUSCULAR | Status: AC | PRN
  Administered 2022-11-10: 100 mL via INTRAVENOUS

## 2022-11-10 MED ORDER — SODIUM CHLORIDE 0.9 % IV BOLUS
1000.0000 mL | Freq: Once | INTRAVENOUS | Status: AC
  Administered 2022-11-10: 1000 mL via INTRAVENOUS

## 2022-11-10 NOTE — ED Provider Notes (Signed)
[facility] Provider Note    Event Date/Time   First MD Initiated Contact with Patient 11/10/22 1625     (approximate)   History   Abdominal Pain   HPI  Todd Fleming is a 37 y.o. male who works outside a lot presents to the ER for evaluation of left upper quadrant abdominal pain associate with nausea vomiting and decreased appetite over the past few days.  No measured fevers has had some chills.  Denies any right lower pain no flank pain.  No numbness or tingling.  States he has had multiple tick bites.     Physical Exam   Triage Vital Signs: ED Triage Vitals  Encounter Vitals Group     BP 11/10/22 1519 (!) 164/98     Systolic BP Percentile --      Diastolic BP Percentile --      Pulse Rate 11/10/22 1519 71     Resp 11/10/22 1519 14     Temp 11/10/22 1519 99 F (37.2 C)     Temp Source 11/10/22 1519 Oral     SpO2 11/10/22 1519 95 %     Weight 11/10/22 1639 182 lb 12.2 oz (82.9 kg)     Height 11/10/22 1639 5\' 8"  (1.727 m)     Head Circumference --      Peak Flow --      Pain Score 11/10/22 1518 7     Pain Loc --      Pain Education --      Exclude from Growth Chart --     Most recent vital signs: Vitals:   11/10/22 1519  BP: (!) 164/98  Pulse: 71  Resp: 14  Temp: 99 F (37.2 C)  SpO2: 95%     Constitutional: Alert  Eyes: Conjunctivae are normal.  Head: Atraumatic. Nose: No congestion/rhinnorhea. Mouth/Throat: Mucous membranes are moist.   Neck: Painless ROM.  Cardiovascular:   Good peripheral circulation. Respiratory: Normal respiratory effort.  No retractions.  Gastrointestinal: Soft and mild tenderness to palpation in the left upper and lower quadrant.  No guarding or rebound..  Musculoskeletal:  no deformity Neurologic:  MAE spontaneously. No gross focal neurologic deficits are appreciated.  Skin:  Skin is warm, dry and intact. No rash noted. Psychiatric: Mood and affect are normal. Speech and behavior are  normal.    ED Results / Procedures / Treatments   Labs (all labs ordered are listed, but only abnormal results are displayed) Labs Reviewed  COMPREHENSIVE METABOLIC PANEL - Abnormal; Notable for the following components:      Result Value   CO2 20 (*)    Glucose, Bld 101 (*)    Total Protein 8.2 (*)    AST 61 (*)    ALT 86 (*)    Total Bilirubin 1.8 (*)    Anion gap 16 (*)    All other components within normal limits  CBC - Abnormal; Notable for the following components:   WBC 14.8 (*)    All other components within normal limits  URINALYSIS, ROUTINE W REFLEX MICROSCOPIC - Abnormal; Notable for the following components:   Color, Urine YELLOW (*)    APPearance CLEAR (*)    Ketones, ur 80 (*)    All other components within normal limits  LIPASE, BLOOD     EKG     RADIOLOGY Please see ED Course for my review and interpretation.  I personally reviewed all radiographic images ordered to evaluate for the above acute  complaints and reviewed radiology reports and findings.  These findings were personally discussed with the patient.  Please see medical record for radiology report.    PROCEDURES:  Critical Care performed:   Procedures   MEDICATIONS ORDERED IN ED: Medications  dextrose 5% lactated ringers bolus 1,000 mL (has no administration in time range)  pantoprazole (PROTONIX) EC tablet 40 mg (has no administration in time range)  sodium chloride 0.9 % bolus 1,000 mL (1,000 mLs Intravenous New Bag/Given 11/10/22 1635)  iohexol (OMNIPAQUE) 300 MG/ML solution 100 mL (100 mLs Intravenous Contrast Given 11/10/22 1705)     IMPRESSION / MDM / ASSESSMENT AND PLAN / ED COURSE  I reviewed the triage vital signs and the nursing notes.                              Differential diagnosis includes, but is not limited to, colitis, enteritis, diverticulitis, SBO, tickborne illness, UTI, pyelo-, stone  Patient presenting to the ER for evaluation of symptoms as described  above.  Based on symptoms, risk factors and considered above differential, this presenting complaint could reflect a potentially life-threatening illness therefore the patient will be placed on continuous pulse oximetry and telemetry for monitoring.  Laboratory evaluation will be sent to evaluate for the above complaints.      Clinical Course as of 11/10/22 1905  Wed Nov 10, 2022  1717 Imaging on my review and interpretation without any evidence of obstruction. [PR]  1902 CT imaging reassuring.  Repeat abdominal exam is soft and benign.  Given his ketones will order bolus of IV fluids.  He is not on any medications that would induce euglycemic DKA.  He is not acidotic.  Bicarb is 20.  This more likely secondary to alcohol ketoacidosis.  Patient Requesting ginger ale.  We discussed supportive care signs and symptoms for which patient should return to the ER. [PR]    Clinical Course User Index [PR] Willy Eddy, MD     FINAL CLINICAL IMPRESSION(S) / ED DIAGNOSES   Final diagnoses:  Epigastric pain     Rx / DC Orders   ED Discharge Orders          Ordered    ondansetron (ZOFRAN-ODT) 4 MG disintegrating tablet  Every 8 hours PRN        11/10/22 1905    pantoprazole (PROTONIX) 40 MG tablet  Daily        11/10/22 1905             Note:  This document was prepared using Dragon voice recognition software and may include unintentional dictation errors.    Willy Eddy, MD 11/10/22 Ebony Cargo

## 2022-11-10 NOTE — ED Notes (Signed)
Patient verbalizes understanding of discharge instructions. Opportunity for questioning and answers were provided. Armband removed by staff, pt discharged from ED. Ambulated out to lobby with mother

## 2022-11-10 NOTE — ED Triage Notes (Signed)
Pt has had lack of appetite for about a week and he has had LUQ carmping pain which is worse today.  LBM this am, pt has had nausea and vomiting with this.  No pain or burning with urination

## 2022-11-12 ENCOUNTER — Ambulatory Visit: Payer: BC Managed Care – PPO | Admitting: Family Medicine

## 2023-04-07 ENCOUNTER — Telehealth: Payer: Self-pay | Admitting: Family Medicine

## 2023-04-07 ENCOUNTER — Other Ambulatory Visit: Payer: Self-pay

## 2023-04-07 DIAGNOSIS — F411 Generalized anxiety disorder: Secondary | ICD-10-CM

## 2023-04-07 MED ORDER — ESCITALOPRAM OXALATE 20 MG PO TABS: 40.0000 mg | ORAL_TABLET | Freq: Every day | ORAL | 0 refills | Status: DC

## 2023-04-07 NOTE — Telephone Encounter (Signed)
[facility] faxed refill request for the following medications:   escitalopram (LEXAPRO) 20 MG tablet    Please advise.  

## 2023-04-22 ENCOUNTER — Encounter: Payer: BC Managed Care – PPO | Admitting: Family Medicine

## 2023-05-04 ENCOUNTER — Other Ambulatory Visit: Payer: Self-pay | Admitting: Family Medicine

## 2023-05-04 DIAGNOSIS — F411 Generalized anxiety disorder: Secondary | ICD-10-CM

## 2023-05-05 NOTE — Telephone Encounter (Signed)
 Unable to refill per protocol, appointment needed. Courtesy refill already given.  Requested Prescriptions  Pending Prescriptions Disp Refills   escitalopram (LEXAPRO) 20 MG tablet [Pharmacy Med Name: ESCITALOPRAM 20 MG TABLET] 180 tablet 1    Sig: TAKE 2 TABLETS (40 MG TOTAL) BY MOUTH DAILY.     Psychiatry:  Antidepressants - SSRI Failed - 05/05/2023  1:34 PM      Failed - Valid encounter within last 6 months    Recent Outpatient Visits           1 year ago Annual physical exam   [place] [facility] [name], FNP   1 year ago GAD (generalized anxiety disorder)   [place] [facility] Merita Norton T, FNP   1 year ago Gastroesophageal reflux disease with esophagitis without hemorrhage   [place] [facility] [name], FNP   1 year ago GAD (generalized anxiety disorder)   [facility] [name], FNP   2 years ago Annual physical exam   [facility] [name], FNP

## 2023-05-08 ENCOUNTER — Other Ambulatory Visit: Payer: Self-pay | Admitting: Family Medicine

## 2023-05-08 DIAGNOSIS — F411 Generalized anxiety disorder: Secondary | ICD-10-CM

## 2023-05-10 NOTE — Telephone Encounter (Signed)
 Requested medication (s) are due for refill today: yes   Requested medication (s) are on the active medication list: yes   Last refill:  04/07/23 #60 0 refills   Future visit scheduled: no   Notes to clinic:  do you want to give another courtesy refill? Called patient [phone] and recording call can not be completed as dialed. Unable to leave message to schedule appt for medication refills.     Requested Prescriptions  Pending Prescriptions Disp Refills   escitalopram (LEXAPRO) 20 MG tablet [Pharmacy Med Name: ESCITALOPRAM 20 MG TABLET] 60 tablet 0    Sig: Take 2 tablets (40 mg total) by mouth daily.     Psychiatry:  Antidepressants - SSRI Failed - 05/10/2023  8:28 AM      Failed - Valid encounter within last 6 months    Recent Outpatient Visits           1 year ago Annual physical exam   [place] [facility] [name], FNP   1 year ago GAD (generalized anxiety disorder)   [place] [facility] Merita Norton T, FNP   1 year ago Gastroesophageal reflux disease with esophagitis without hemorrhage    [facility] [name], FNP   1 year ago GAD (generalized anxiety disorder)   [facility] [name], FNP   2 years ago Annual physical exam   [facility] [name], FNP

## 2023-05-10 NOTE — Telephone Encounter (Signed)
 Attempted to contact patient x 2 and recording call can not be completed as dialed. Unable to leave message to schedule appt for medication refills/ annual OV.

## 2023-05-17 DIAGNOSIS — F1020 Alcohol dependence, uncomplicated: Secondary | ICD-10-CM | POA: Diagnosis not present

## 2023-05-22 DIAGNOSIS — F1020 Alcohol dependence, uncomplicated: Secondary | ICD-10-CM | POA: Diagnosis not present

## 2023-06-15 DIAGNOSIS — F1020 Alcohol dependence, uncomplicated: Secondary | ICD-10-CM | POA: Diagnosis not present

## 2023-06-16 DIAGNOSIS — F1020 Alcohol dependence, uncomplicated: Secondary | ICD-10-CM | POA: Diagnosis not present

## 2023-06-17 DIAGNOSIS — F1020 Alcohol dependence, uncomplicated: Secondary | ICD-10-CM | POA: Diagnosis not present

## 2023-06-20 DIAGNOSIS — F1020 Alcohol dependence, uncomplicated: Secondary | ICD-10-CM | POA: Diagnosis not present

## 2023-06-21 DIAGNOSIS — F1020 Alcohol dependence, uncomplicated: Secondary | ICD-10-CM | POA: Diagnosis not present

## 2023-06-22 DIAGNOSIS — F1020 Alcohol dependence, uncomplicated: Secondary | ICD-10-CM | POA: Diagnosis not present

## 2023-06-23 DIAGNOSIS — F1020 Alcohol dependence, uncomplicated: Secondary | ICD-10-CM | POA: Diagnosis not present

## 2023-06-24 DIAGNOSIS — F1020 Alcohol dependence, uncomplicated: Secondary | ICD-10-CM | POA: Diagnosis not present

## 2023-06-27 DIAGNOSIS — F1020 Alcohol dependence, uncomplicated: Secondary | ICD-10-CM | POA: Diagnosis not present

## 2023-06-28 DIAGNOSIS — F1020 Alcohol dependence, uncomplicated: Secondary | ICD-10-CM | POA: Diagnosis not present

## 2023-06-29 DIAGNOSIS — F1020 Alcohol dependence, uncomplicated: Secondary | ICD-10-CM | POA: Diagnosis not present

## 2023-08-24 ENCOUNTER — Ambulatory Visit: Admitting: Physician Assistant

## 2023-08-24 ENCOUNTER — Encounter: Payer: Self-pay | Admitting: Physician Assistant

## 2023-08-24 VITALS — BP 105/72 | HR 64 | Resp 16 | Ht 68.0 in | Wt 165.0 lb

## 2023-08-24 DIAGNOSIS — R748 Abnormal levels of other serum enzymes: Secondary | ICD-10-CM

## 2023-08-24 DIAGNOSIS — K2100 Gastro-esophageal reflux disease with esophagitis, without bleeding: Secondary | ICD-10-CM

## 2023-08-24 DIAGNOSIS — F411 Generalized anxiety disorder: Secondary | ICD-10-CM

## 2023-08-24 DIAGNOSIS — F5104 Psychophysiologic insomnia: Secondary | ICD-10-CM

## 2023-08-24 DIAGNOSIS — E559 Vitamin D deficiency, unspecified: Secondary | ICD-10-CM

## 2023-08-24 DIAGNOSIS — F1020 Alcohol dependence, uncomplicated: Secondary | ICD-10-CM

## 2023-08-24 DIAGNOSIS — R739 Hyperglycemia, unspecified: Secondary | ICD-10-CM

## 2023-08-24 DIAGNOSIS — F339 Major depressive disorder, recurrent, unspecified: Secondary | ICD-10-CM

## 2023-08-24 DIAGNOSIS — Z136 Encounter for screening for cardiovascular disorders: Secondary | ICD-10-CM

## 2023-08-24 MED ORDER — TRAZODONE HCL 50 MG PO TABS
25.0000 mg | ORAL_TABLET | Freq: Every evening | ORAL | 3 refills | Status: DC | PRN
Start: 2023-08-24 — End: 2023-09-19

## 2023-08-24 MED ORDER — NALTREXONE HCL 50 MG PO TABS
50.0000 mg | ORAL_TABLET | Freq: Every day | ORAL | 1 refills | Status: DC
Start: 2023-08-24 — End: 2023-10-23

## 2023-08-24 MED ORDER — BUSPIRONE HCL 10 MG PO TABS
10.0000 mg | ORAL_TABLET | Freq: Two times a day (BID) | ORAL | 1 refills | Status: DC
Start: 2023-08-24 — End: 2023-09-19

## 2023-08-24 MED ORDER — PANTOPRAZOLE SODIUM 40 MG PO TBEC: 40.0000 mg | DELAYED_RELEASE_TABLET | Freq: Every day | ORAL | 1 refills | Status: DC

## 2023-08-24 MED ORDER — ESCITALOPRAM OXALATE 20 MG PO TABS
40.0000 mg | ORAL_TABLET | Freq: Every day | ORAL | 0 refills | Status: DC
Start: 2023-08-24 — End: 2023-09-19

## 2023-08-24 NOTE — Progress Notes (Signed)
 Established patient visit  Patient: Todd Fleming   DOB: 1985-04-28   38 y.o. Male  MRN: [id number] Visit Date: 08/24/2023  Today's healthcare provider: Jolynn Spencer, PA-C   Chief Complaint  Patient presents with   Transitions Of Care    TOC/REFILLS   Subjective     HPI     Transitions Of Care    Additional comments: TOC/REFILLS      Last edited by Marylen Odella CROME, CMA on 08/24/2023  1:36 PM.       Discussed the use of AI scribe software for clinical note transcription with the patient, who gave verbal consent to proceed.  History of Present Illness Todd Fleming is a 38 year old male with alcohol use disorder who presents for medication management and follow-up after rehabilitation.  He has been sober for 97 days after completing a 60-day rehabilitation program, with his last drink on May 18, 2023. He attends Alcoholics Anonymous meetings weekly, which he finds challenging due to his work schedule. Naltrexone  50 mg daily effectively manages his cravings.  He takes Lexapro  twice daily, omeprazole  once daily, and trazodone  for sleep and anxiety. Trazodone  can cause nausea if taken on an empty stomach, so he eats a little before taking it. Omeprazole  is used for acid reflux, which occurs if doses are missed for a few days. Certain foods like tomatoes and spicy foods can trigger symptoms.  Anxiety and depression have improved since he stopped drinking. He is not under the care of a behavioral health specialist and relies on his primary care provider for medication management. He reports no abdominal pain, nausea, vomiting, or irregular bowel movements and has regular bowel movements.       08/24/2023    1:47 PM 04/16/2022    3:54 PM 03/05/2022    4:09 PM  Depression screen PHQ 2/9  Decreased Interest 0 0 1  Down, Depressed, Hopeless 0 1 2  PHQ - 2 Score 0 1 3  Altered sleeping 0 0 2  Tired, decreased energy 0 0 0  Change in appetite 0 0 0  Feeling bad or failure  about yourself  0 0 1  Trouble concentrating 0 0 0  Moving slowly or fidgety/restless 0 0 0  Suicidal thoughts 0 0 0  PHQ-9 Score 0 1 6  Difficult doing work/chores Somewhat difficult Not difficult at all Very difficult      08/24/2023    1:47 PM 03/05/2022    4:49 PM 01/01/2022    3:47 PM 11/13/2021   10:57 AM  GAD 7 : Generalized Anxiety Score  Nervous, Anxious, on Edge 0 2 2 3   Control/stop worrying 0 2 1 1   Worry too much - different things 0 2 2 3   Trouble relaxing 0 2 2 3   Restless 0 0 1 1  Easily annoyed or irritable 0 2 3 3   Afraid - awful might happen 0 0 0 0  Total GAD 7 Score 0 10 11 14   Anxiety Difficulty Somewhat difficult Somewhat difficult Somewhat difficult Somewhat difficult    Medications: Outpatient Medications Prior to Visit  Medication Sig   Cholecalciferol (VITAMIN D3) 125 MCG (5000 UT) capsule Take 1 capsule (5,000 Units total) by mouth daily.   ondansetron  (ZOFRAN -ODT) 4 MG disintegrating tablet Take 1 tablet (4 mg total) by mouth every 8 (eight) hours as needed for nausea or vomiting.   traZODone  (DESYREL ) 100 MG tablet TAKE 1 TABLET BY MOUTH EVERYDAY AT BEDTIME   [DISCONTINUED] busPIRone  (BUSPAR )  10 MG tablet Take 10 mg by mouth 2 (two) times daily.   [DISCONTINUED] escitalopram  (LEXAPRO ) 20 MG tablet Take 2 tablets (40 mg total) by mouth daily.   [DISCONTINUED] naltrexone  (DEPADE) 50 MG tablet Take 50 mg by mouth at bedtime.   [DISCONTINUED] omeprazole  (PRILOSEC) 40 MG capsule Take 40 mg by mouth daily.   [DISCONTINUED] pantoprazole  (PROTONIX ) 40 MG tablet Take 1 tablet (40 mg total) by mouth daily.   No facility-administered medications prior to visit.    Review of Systems All negative Except see HPI       Objective    BP 105/72 (BP Location: Left Arm, Patient Position: Sitting, Cuff Size: Normal)   Pulse 64   Resp 16   Ht 5' 8 (1.727 m)   Wt 165 lb (74.8 kg)   SpO2 100%   BMI 25.09 kg/m     Physical Exam Vitals reviewed.   Constitutional:      General: He is not in acute distress.    Appearance: Normal appearance. He is not diaphoretic.  HENT:     Head: Normocephalic and atraumatic.  Eyes:     General: No scleral icterus.    Conjunctiva/sclera: Conjunctivae normal.  Cardiovascular:     Rate and Rhythm: Normal rate and regular rhythm.     Pulses: Normal pulses.     Heart sounds: Normal heart sounds. No murmur heard. Pulmonary:     Effort: Pulmonary effort is normal. No respiratory distress.     Breath sounds: Normal breath sounds. No wheezing or rhonchi.  Musculoskeletal:     Cervical back: Neck supple.     Right lower leg: No edema.     Left lower leg: No edema.  Lymphadenopathy:     Cervical: No cervical adenopathy.  Skin:    General: Skin is warm and dry.     Findings: No rash.  Neurological:     Mental Status: He is alert and oriented to person, place, and time. Mental status is at baseline.  Psychiatric:        Mood and Affect: Mood normal.        Behavior: Behavior normal.      No results found for any visits on 08/24/23.      Transition of care from [facility] Assessment & Plan Alcohol Use Disorder Sober for 97 days post-rehabilitation. Attends AA meetings. On naltrexone  for cravings. Anxiety and depression improved. Concerns about group meetings increasing cravings. Requires specialized behavioral health support. - Prescribe naltrexone  for 30 days, consider extending to 90 days. - Refer to behavioral health for ongoing management and medication adjustment. - Encourage individualized behavioral health support. - Consider RHI in [place] or a facility in [place] for support and for prompt evaluation Will follow-up  Anxiety and Depression Chronic Improved since alcohol cessation. On Lexapro , Buspar , and trazodone . Discussed medication adjustment and importance of not discontinuing abruptly. - Prescribe Lexapro  20 bid, Buspar  10 up to 2 times daily, and trazodone  50mg  as  current regimen. - Advise reducing Buspar  to twice daily from three times daily and trazodone  to half a tablet. - Monitor for changes in symptoms. Will follow-up  Gastroesophageal Reflux Disease (GERD) Long-standing GERD on omeprazole . Triggered by certain foods. Discussed lifestyle modifications. Consider gastroenterology referral if symptoms worsen or complications arise. - Prescribe pantoprazole  instead of omeprazole  as pt likes its effects better. - Advise lifestyle modifications, including avoiding eating before bed and reducing intake of trigger foods. - Consider gastroenterology referral if symptoms worsen or complications are  suspected. Will follow-up  General Health Maintenance Declined COVID, HPV, and pneumonia vaccines. Discussed vitamin D  supplementation. - Recheck vitamin D  levels. - Advise taking 2000 IU of vitamin D3 over the counter if necessary.  Follow-up Follow up in six weeks to assess progress with behavioral health referral and medication management. - Schedule follow-up appointment in six weeks. - Perform blood work including vitamin D , lipids, and CMP before the next appointment.  GAD (generalized anxiety disorder) - busPIRone  (BUSPAR ) 10 MG tablet; Take 1 tablet (10 mg total) by mouth 2 (two) times daily.  Dispense: 60 tablet; Refill: 1 - escitalopram  (LEXAPRO ) 20 MG tablet; Take 2 tablets (40 mg total) by mouth daily.  Dispense: 60 tablet; Refill: 0 - naltrexone  (DEPADE) 50 MG tablet; Take 1 tablet (50 mg total) by mouth at bedtime.  Dispense: 30 tablet; Refill: 1 - traZODone  (DESYREL ) 50 MG tablet; Take 0.5-1 tablets (25-50 mg total) by mouth at bedtime as needed for sleep.  Dispense: 30 tablet; Refill: 3 - pantoprazole  (PROTONIX ) 40 MG tablet; Take 1 tablet (40 mg total) by mouth daily.  Dispense: 30 tablet; Refill: 1 - Lipid panel - Comprehensive metabolic panel with GFR - CBC with Differential/Platelet - VITAMIN D  25 Hydroxy (Vit-D Deficiency,  Fractures) - Ambulatory referral to Psychiatry  Psychophysiological insomnia - busPIRone  (BUSPAR ) 10 MG tablet; Take 1 tablet (10 mg total) by mouth 2 (two) times daily.  Dispense: 60 tablet; Refill: 1 - escitalopram  (LEXAPRO ) 20 MG tablet; Take 2 tablets (40 mg total) by mouth daily.  Dispense: 60 tablet; Refill: 0 - naltrexone  (DEPADE) 50 MG tablet; Take 1 tablet (50 mg total) by mouth at bedtime.  Dispense: 30 tablet; Refill: 1 - traZODone  (DESYREL ) 50 MG tablet; Take 0.5-1 tablets (25-50 mg total) by mouth at bedtime as needed for sleep.  Dispense: 30 tablet; Refill: 3 - pantoprazole  (PROTONIX ) 40 MG tablet; Take 1 tablet (40 mg total) by mouth daily.  Dispense: 30 tablet; Refill: 1 - Lipid panel - Comprehensive metabolic panel with GFR - CBC with Differential/Platelet - VITAMIN D  25 Hydroxy (Vit-D Deficiency, Fractures) - Ambulatory referral to Psychiatry  Gastroesophageal reflux disease with esophagitis without hemorrhage (Primary) - pantoprazole  (PROTONIX ) 40 MG tablet; Take 1 tablet (40 mg total) by mouth daily.  Dispense: 30 tablet; Refill: 1  Alcohol use disorder, moderate, dependence (HCC) - naltrexone  (DEPADE) 50 MG tablet; Take 1 tablet (50 mg total) by mouth at bedtime.  Dispense: 30 tablet; Refill: 1  Avitaminosis D - VITAMIN D  25 Hydroxy (Vit-D Deficiency, Fractures)  Elevated serum glucose - Comprehensive metabolic panel with GFR  Elevated liver enzymes - Comprehensive metabolic panel with GFR  Depression, recurrent (HCC)  - busPIRone  (BUSPAR ) 10 MG tablet; Take 1 tablet (10 mg total) by mouth 2 (two) times daily.  Dispense: 60 tablet; Refill: 1 - escitalopram  (LEXAPRO ) 20 MG tablet; Take 2 tablets (40 mg total) by mouth daily.  Dispense: 60 tablet; Refill: 0 - Ambulatory referral to Psychiatry  Screening for cardiovascular condition  - Lipid panel - Comprehensive metabolic panel with GFR - CBC with Differential/Platelet    Orders Placed This Encounter   Procedures   Lipid panel    Has the patient fasted?:   Yes   Comprehensive metabolic panel with GFR    Has the patient fasted?:   Yes   CBC with Differential/Platelet   VITAMIN D  25 Hydroxy (Vit-D Deficiency, Fractures)   Ambulatory referral to Psychiatry    Referral Priority:   Routine    Referral Type:  Psychiatric    Referral Reason:   Specialty Services Required    Requested Specialty:   Psychiatry    Number of Visits Requested:   1    Return in about 6 weeks (around 10/05/2023) for CPE.   The patient was advised to call back or seek an in-person evaluation if the symptoms worsen or if the condition fails to improve as anticipated.  I discussed the assessment and treatment plan with the patient. The patient was provided an opportunity to ask questions and all were answered. The patient agreed with the plan and demonstrated an understanding of the instructions.  I, Tobenna Needs, PA-C have reviewed all documentation for this visit. The documentation on 08/24/2023  for the exam, diagnosis, procedures, and orders are all accurate and complete.  Jolynn Spencer, [facility], MMS [facility] [phone] (phone) [phone] (fax)  [facility]

## 2023-09-02 ENCOUNTER — Encounter: Admitting: Family Medicine

## 2023-09-15 ENCOUNTER — Other Ambulatory Visit: Payer: Self-pay | Admitting: Physician Assistant

## 2023-09-15 DIAGNOSIS — F411 Generalized anxiety disorder: Secondary | ICD-10-CM

## 2023-09-15 DIAGNOSIS — F5104 Psychophysiologic insomnia: Secondary | ICD-10-CM

## 2023-09-15 DIAGNOSIS — F339 Major depressive disorder, recurrent, unspecified: Secondary | ICD-10-CM

## 2023-09-15 DIAGNOSIS — K2100 Gastro-esophageal reflux disease with esophagitis, without bleeding: Secondary | ICD-10-CM

## 2023-10-07 ENCOUNTER — Encounter: Payer: Self-pay | Admitting: Physician Assistant

## 2023-10-07 ENCOUNTER — Ambulatory Visit: Admitting: Physician Assistant

## 2023-10-07 VITALS — BP 126/73 | HR 88 | Ht 68.0 in | Wt 171.0 lb

## 2023-10-07 DIAGNOSIS — K2100 Gastro-esophageal reflux disease with esophagitis, without bleeding: Secondary | ICD-10-CM

## 2023-10-07 DIAGNOSIS — F1020 Alcohol dependence, uncomplicated: Secondary | ICD-10-CM

## 2023-10-07 DIAGNOSIS — M79601 Pain in right arm: Secondary | ICD-10-CM | POA: Insufficient documentation

## 2023-10-07 DIAGNOSIS — E559 Vitamin D deficiency, unspecified: Secondary | ICD-10-CM

## 2023-10-07 DIAGNOSIS — F339 Major depressive disorder, recurrent, unspecified: Secondary | ICD-10-CM

## 2023-10-07 DIAGNOSIS — F411 Generalized anxiety disorder: Secondary | ICD-10-CM | POA: Diagnosis not present

## 2023-10-07 DIAGNOSIS — R202 Paresthesia of skin: Secondary | ICD-10-CM

## 2023-10-07 DIAGNOSIS — F5104 Psychophysiologic insomnia: Secondary | ICD-10-CM

## 2023-10-07 DIAGNOSIS — M79602 Pain in left arm: Secondary | ICD-10-CM

## 2023-10-07 NOTE — Progress Notes (Signed)
 Established patient visit  Patient: Todd Fleming   DOB: October 22, 1985   38 y.o. Male  MRN: [id number] Visit Date: 10/07/2023  Today's healthcare provider: Jolynn Spencer, PA-C   Chief Complaint  Patient presents with   Gastroesophageal Reflux    No concerns    Subjective     Discussed the use of AI scribe software for clinical note transcription with the patient, who gave verbal consent to proceed.  History of Present Illness Todd Fleming is a 38 year old male with anxiety, depression, and gastroesophageal reflux disease who presents for follow-up on his mental health and substance use management.  He maintains sobriety and takes naltrexone  daily. He attends Alcoholics Anonymous meetings and participates in a Market researcher. He experiences moderate anxiety and mild depression while taking Lexapro  40 mg daily, Buspar  20 mg twice daily, and trazodone  50 mg at bedtime. He is unsure of the effectiveness of these medications.  He takes pantoprazole  40 mg daily for gastroesophageal reflux disease and denies gastrointestinal symptoms. 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 He works extensively with limited time off, affecting his ability to schedule medical appointments.  He has not had recent blood work since March 2024 and has not been taking vitamin D  supplements. He plans to have blood work done next Friday.       08/24/2023    1:47 PM 04/16/2022    3:54 PM 03/05/2022    4:09 PM  Depression screen PHQ 2/9  Decreased Interest 0 0 1  Down, Depressed, Hopeless 0 1 2  PHQ - 2 Score 0 1 3  Altered sleeping 0 0 2  Tired, decreased energy 0 0 0  Change in appetite 0 0 0  Feeling bad or failure about yourself  0 0 1  Trouble concentrating 0 0 0  Moving slowly or fidgety/restless 0 0 0  Suicidal thoughts 0 0 0  PHQ-9 Score 0 1 6  Difficult doing work/chores Somewhat difficult Not difficult at  all Very difficult      08/24/2023    1:47 PM 03/05/2022    4:49 PM 01/01/2022    3:47 PM 11/13/2021   10:57 AM  GAD 7 : Generalized Anxiety Score  Nervous, Anxious, on Edge 0 2 2 3   Control/stop worrying 0 2 1 1   Worry too much - different things 0 2 2 3   Trouble relaxing 0 2 2 3   Restless 0 0 1 1  Easily annoyed or irritable 0 2 3 3   Afraid - awful might happen 0 0 0 0  Total GAD 7 Score 0 10 11 14   Anxiety Difficulty Somewhat difficult Somewhat difficult Somewhat difficult Somewhat difficult    Medications: Outpatient Medications Prior to Visit  Medication Sig   busPIRone  (BUSPAR ) 10 MG tablet TAKE 1 TABLET BY MOUTH TWICE A DAY   Cholecalciferol (VITAMIN D3) 125 MCG (5000 UT) capsule Take 1 capsule (5,000 Units total) by mouth daily.   escitalopram  (LEXAPRO ) 20 MG tablet TAKE 2 TABLETS (40 MG TOTAL) BY MOUTH DAILY.   naltrexone  (DEPADE) 50 MG tablet Take 1 tablet (50 mg total) by mouth at bedtime.   ondansetron  (ZOFRAN -ODT) 4 MG disintegrating tablet Take 1 tablet (4 mg total) by mouth every 8 (eight) hours as needed for nausea or vomiting. (Patient not taking: Reported on 10/07/2023)   pantoprazole  (PROTONIX ) 40 MG tablet TAKE 1 TABLET BY MOUTH EVERY DAY   traZODone  (DESYREL ) 100  MG tablet TAKE 1 TABLET BY MOUTH EVERYDAY AT BEDTIME (Patient not taking: Reported on 10/07/2023)   traZODone  (DESYREL ) 50 MG tablet TAKE 0.5-1 TABLET BY MOUTH AT BEDTIME AS NEEDED FOR SLEEP.   No facility-administered medications prior to visit.    Review of Systems  All other systems reviewed and are negative.  All negative Except see HPI       Objective    BP 126/73   Pulse 88   Ht 5' 8 (1.727 m)   Wt 171 lb (77.6 kg)   SpO2 98%   BMI 26.00 kg/m     Physical Exam Vitals reviewed.  Constitutional:      General: He is not in acute distress.    Appearance: Normal appearance. He is not diaphoretic.  HENT:     Head: Normocephalic and atraumatic.  Eyes:     General: No scleral  icterus.    Conjunctiva/sclera: Conjunctivae normal.  Cardiovascular:     Rate and Rhythm: Normal rate and regular rhythm.     Pulses: Normal pulses.     Heart sounds: Normal heart sounds. No murmur heard. Pulmonary:     Effort: Pulmonary effort is normal. No respiratory distress.     Breath sounds: Normal breath sounds. No wheezing or rhonchi.  Musculoskeletal:     Cervical back: Neck supple.     Right lower leg: No edema.     Left lower leg: No edema.  Lymphadenopathy:     Cervical: No cervical adenopathy.  Skin:    General: Skin is warm and dry.     Findings: No rash.  Neurological:     Mental Status: He is alert and oriented to person, place, and time. Mental status is at baseline.  Psychiatric:        Mood and Affect: Mood normal.        Behavior: Behavior normal.      No results found for any visits on 10/07/23.      Assessment & Plan Alcohol use disorder, in remission Alcohol use disorder is in remission with reported sobriety. - Continue naltrexone  daily. - Continue attending AA meetings and mentorship program.  Generalized anxiety disorder and depressive disorder Insomnia Chronic Per PHQ and Gad7 scoring presents with moderate anxiety and mild depression. Current medications include Lexapro  40 mg, buspirone  20 mg twice daily, and trazodone  50 mg at bedtime. He is uncertain about the regimen's effectiveness. No current behavioral health support. - Refer to in-office counseling with psychiatrist for medication assessment and potential adjustment. - Continue current medications: Lexapro  40 mg, buspirone  20 mg twice daily, trazodone  50 mg at bedtime. - Behavioral health referral remains open for scheduling. Collaboration of Care: Medication Management AEB  , Primary Care Provider AEB  , Psychiatrist AEB  , and Referral or follow-up with counselor/therapist AEB    Patient/Guardian was advised Release of Information must be obtained prior to any record release in order  to collaborate their care with an outside provider. Patient/Guardian was advised if they have not already done so to contact the registration department to sign all necessary forms in order for us  to release information regarding their care.   Consent: Patient/Guardian gives verbal consent for treatment and assignment of benefits for services provided during this visit. Patient/Guardian expressed understanding and agreed to proceed.    Gastroesophageal reflux disease Gastroesophageal reflux disease is managed with pantoprazole  40 mg daily. - Continue pantoprazole  40 mg daily. 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 No neurological deficits such as dizziness or double vision reported. - Consider physical therapy for exercise guidance. Advised NSAIDs OTC with food for pain control and alleviation of sensory symptoms Advised to avoid repetitive or prolonged flexion of the elbow and use protective gear like elbow pads for symptoms reduction.  General Health Maintenance He has not received a flu vaccine and has not had recent blood work.   Vitamin D  deficiency Vitamin D  levels need to be checked. - Encourage flu vaccination in October. - Schedule blood work to check vitamin D  levels. - Consider vitamin D  supplementation based on lab results. Labs are pending since 08/24/23  Follow-Up Follow-up is needed to assess the effectiveness of counseling and any medication adjustments. - Schedule follow-up appointment in three months. - Ensure availability for counseling sessions.   No orders of the defined types were placed in this encounter.   No follow-ups on file.   The patient was advised to call back or seek an in-person evaluation if the  symptoms worsen or if the condition fails to improve as anticipated.  I discussed the assessment and treatment plan with the patient. The patient was provided an opportunity to ask questions and all were answered. The patient agreed with the plan and demonstrated an understanding of the instructions.  I, Docie Abramovich, PA-C have reviewed all documentation for this visit. The documentation on 10/07/2023  for the exam, diagnosis, procedures, and orders are all accurate and complete.  Jolynn Spencer, [facility], MMS [facility] [phone] (phone) [phone] (fax)  [facility]

## 2023-10-21 ENCOUNTER — Other Ambulatory Visit: Payer: Self-pay | Admitting: Physician Assistant

## 2023-10-21 DIAGNOSIS — F5104 Psychophysiologic insomnia: Secondary | ICD-10-CM

## 2023-10-21 DIAGNOSIS — F411 Generalized anxiety disorder: Secondary | ICD-10-CM

## 2023-10-21 DIAGNOSIS — F1020 Alcohol dependence, uncomplicated: Secondary | ICD-10-CM

## 2023-12-20 ENCOUNTER — Other Ambulatory Visit: Payer: Self-pay | Admitting: Physician Assistant

## 2023-12-20 DIAGNOSIS — F339 Major depressive disorder, recurrent, unspecified: Secondary | ICD-10-CM

## 2023-12-20 DIAGNOSIS — F411 Generalized anxiety disorder: Secondary | ICD-10-CM

## 2023-12-20 DIAGNOSIS — K2100 Gastro-esophageal reflux disease with esophagitis, without bleeding: Secondary | ICD-10-CM

## 2023-12-20 DIAGNOSIS — F5104 Psychophysiologic insomnia: Secondary | ICD-10-CM

## 2023-12-20 NOTE — Telephone Encounter (Signed)
 LOV 10/07/23 NOV 01/06/24 LRF 09/19/23 qty:90 r:0 Trazodone 

## 2024-01-06 ENCOUNTER — Ambulatory Visit: Admitting: Physician Assistant

## 2024-02-10 ENCOUNTER — Other Ambulatory Visit: Payer: Self-pay | Admitting: Physician Assistant

## 2024-02-10 DIAGNOSIS — F419 Anxiety disorder, unspecified: Secondary | ICD-10-CM | POA: Diagnosis not present

## 2024-02-10 DIAGNOSIS — F339 Major depressive disorder, recurrent, unspecified: Secondary | ICD-10-CM

## 2024-02-10 DIAGNOSIS — F411 Generalized anxiety disorder: Secondary | ICD-10-CM

## 2024-02-10 DIAGNOSIS — F5104 Psychophysiologic insomnia: Secondary | ICD-10-CM

## 2024-02-10 DIAGNOSIS — F1091 Alcohol use, unspecified, in remission: Secondary | ICD-10-CM | POA: Diagnosis not present

## 2024-02-10 DIAGNOSIS — K219 Gastro-esophageal reflux disease without esophagitis: Secondary | ICD-10-CM | POA: Diagnosis not present

## 2024-02-10 DIAGNOSIS — F32A Depression, unspecified: Secondary | ICD-10-CM | POA: Diagnosis not present

## 2024-02-12 ENCOUNTER — Other Ambulatory Visit: Payer: Self-pay | Admitting: Physician Assistant

## 2024-02-12 DIAGNOSIS — F1020 Alcohol dependence, uncomplicated: Secondary | ICD-10-CM

## 2024-02-12 DIAGNOSIS — F411 Generalized anxiety disorder: Secondary | ICD-10-CM

## 2024-02-12 DIAGNOSIS — F5104 Psychophysiologic insomnia: Secondary | ICD-10-CM
